# Patient Record
Sex: Female | Born: 1975 | Race: Black or African American | Hispanic: No | Marital: Married | Smoking: Never smoker
Health system: Southern US, Community
[De-identification: ages and names within clinical notes are randomized; demographics above are authoritative.]

## PROBLEM LIST (undated history)

## (undated) ENCOUNTER — Inpatient Hospital Stay: Payer: Self-pay

## (undated) DIAGNOSIS — Z789 Other specified health status: Secondary | ICD-10-CM

---

## 1997-11-26 ENCOUNTER — Other Ambulatory Visit: Admission: RE | Admit: 1997-11-26 | Discharge: 1997-11-26 | Payer: Self-pay | Admitting: Family Medicine

## 1999-05-29 ENCOUNTER — Other Ambulatory Visit: Admission: RE | Admit: 1999-05-29 | Discharge: 1999-05-29 | Payer: Self-pay | Admitting: Family Medicine

## 2000-08-26 ENCOUNTER — Other Ambulatory Visit: Admission: RE | Admit: 2000-08-26 | Discharge: 2000-08-26 | Payer: Self-pay | Admitting: Family Medicine

## 2002-08-17 ENCOUNTER — Other Ambulatory Visit: Admission: RE | Admit: 2002-08-17 | Discharge: 2002-08-17 | Payer: Self-pay | Admitting: Family Medicine

## 2010-01-23 ENCOUNTER — Encounter: Payer: Self-pay | Admitting: Maternal and Fetal Medicine

## 2010-06-12 ENCOUNTER — Inpatient Hospital Stay (HOSPITAL_COMMUNITY)
Admission: AD | Admit: 2010-06-12 | Discharge: 2010-06-17 | DRG: 886 | Disposition: A | Discharge status: Home / Self Care | Payer: BC Managed Care – PPO | Source: Ambulatory Visit | Attending: Obstetrics and Gynecology | Admitting: Obstetrics and Gynecology

## 2010-06-12 ENCOUNTER — Inpatient Hospital Stay (HOSPITAL_COMMUNITY): Payer: BC Managed Care – PPO

## 2010-06-12 DIAGNOSIS — O4410 Placenta previa with hemorrhage, unspecified trimester: Principal | ICD-10-CM | POA: Diagnosis present

## 2010-06-12 LAB — CBC
HCT: 34.8 % — ABNORMAL LOW (ref 36.0–46.0)
MCHC: 33 g/dL (ref 30.0–36.0)
MCV: 87.2 fL (ref 78.0–100.0)
RDW: 13 % (ref 11.5–15.5)
WBC: 5.8 10*3/uL (ref 4.0–10.5)

## 2010-06-13 DIAGNOSIS — O479 False labor, unspecified: Secondary | ICD-10-CM

## 2010-06-13 LAB — CBC
Hemoglobin: 11.9 g/dL — ABNORMAL LOW (ref 12.0–15.0)
MCH: 29 pg (ref 26.0–34.0)
MCHC: 33.2 g/dL (ref 30.0–36.0)

## 2010-06-13 LAB — STREP B DNA PROBE: Strep Group B Ag: NEGATIVE

## 2010-06-20 NOTE — Discharge Summary (Signed)
  Kim Wood, Kim Wood          ACCOUNT NO.:  [id number]  MEDICAL RECORD NO.:  [id number]           PATIENT TYPE:  I  LOCATION:  9159                          FACILITY:  [facility]  PHYSICIAN:  Scheryl Darter, MD       DATE OF BIRTH:  [birth year]  DATE OF ADMISSION:  06/12/2010 DATE OF DISCHARGE:  06/17/2010                              DISCHARGE SUMMARY   DIAGNOSES: 1. Intrauterine pregnancy at 35 weeks, 5 days gestation. 2. Low-lying placenta with third trimester bleed.  The patient is a 35 year old black female gravida 1, para 0, estimated date of confinement on July 17, 2010.  Prenatal care was at [facility] in [place].  She lives in [place].  She had been followed with complete placenta previa.  At about 4:00 a.m. on Jun 12, 2010, she woke with bright red vaginal bleeding.  EMS was called, and she was transported to [facility] because of her proximity.  She has had some contractions.  No rupture of membranes.  She noted fetal movement.  PAST MEDICAL HISTORY:  Unremarkable.  PAST SURGICAL HISTORY:  None.  SOCIAL HISTORY:  The patient is married, nonsmoker, and denies alcohol or drug use.  MEDICATION:  Only medication was prenatal vitamins.  No known drug allergies.  REVIEW OF SYSTEMS:  Positive as noted.  PHYSICAL EXAMINATION:  GENERAL:  The patient is in no acute distress. VITAL SIGNS:  Blood pressure was 139/83, pulse of 63, respirations 18, temperature 97.8. CHEST:  Clear. HEART:  Regular rate and rhythm. ABDOMEN:  Gravid consistent with dates and soft and nontender. PELVIC:  Blood in the vagina with clots.  Cervical os was closed and cervix was long.  There was no active bleeding at the time of examination.  She had contractions at every 10 minutes.  Fetal heart rate is in 150s with no decelerations.  The patient was admitted for observation.  She had good fetal monitoring.  Ultrasound showed posterior marginal placenta previa with placenta  measured normal cervical length.  There was no sign of abruption visualized.  There was normal amniotic fluid.  The patient had only some old blood with spotting after the initial episode.  She was having significant uterine activity.  No sign of labor.  On Jun 17, 2010, she had had no bleeding for greater than 48 hours.  She was discharged home.  She was instructed to have decreased activity and nothing per vagina.  She is to report any further episodes of bleeding.  She is to see her physicians at [facility] in 2 days.  She stated that she was going to relocate closer to [facility] as she previously planned to do.  She is to be out of work.  Her questions were answered.     Scheryl Darter, MD     JA/MEDQ  D:  06/17/2010  T:  06/17/2010  Job:  [id number]  Electronically Signed by Scheryl Darter MD on 06/20/2010 10:30:00 AM

## 2010-07-18 ENCOUNTER — Inpatient Hospital Stay: Payer: Self-pay | Admitting: Obstetrics & Gynecology

## 2012-04-04 ENCOUNTER — Inpatient Hospital Stay: Payer: Self-pay | Admitting: Obstetrics and Gynecology

## 2012-04-04 LAB — CBC WITH DIFFERENTIAL/PLATELET
Basophil %: 1.1 %
HCT: 38.1 % (ref 35.0–47.0)
HGB: 12.8 g/dL (ref 12.0–16.0)
MCV: 89 fL (ref 80–100)
Monocyte #: 0.3 x10 3/mm (ref 0.2–0.9)
Neutrophil #: 4 10*3/uL (ref 1.4–6.5)
Neutrophil %: 68.2 %
Platelet: 196 10*3/uL (ref 150–440)
WBC: 5.9 10*3/uL (ref 3.6–11.0)

## 2012-04-06 LAB — HEMATOCRIT: HCT: 33.6 % — ABNORMAL LOW (ref 35.0–47.0)

## 2014-04-25 ENCOUNTER — Ambulatory Visit
Admit: 2014-04-25 | Disposition: A | Discharge status: Home / Self Care | Payer: Self-pay | Attending: Advanced Practice Midwife | Admitting: Advanced Practice Midwife

## 2014-05-11 ENCOUNTER — Ambulatory Visit
Admit: 2014-05-11 | Disposition: A | Discharge status: Home / Self Care | Payer: Self-pay | Attending: Advanced Practice Midwife | Admitting: Advanced Practice Midwife

## 2014-06-01 NOTE — Op Note (Signed)
PATIENT NAME:  Kim Wood, Kim Wood MR#:  [id number] DATE OF BIRTH:  January 10, 1976  DATE OF PROCEDURE:  04/05/2012  PREOPERATIVE DIAGNOSES: Term intrauterine pregnancy, prior history of cesarean section, desire for repeat cesarean section, labor.   POSTOPERATIVE DIAGNOSES: Term intrauterine pregnancy, prior history of cesarean section, desire for repeat cesarean section, labor.   PROCEDURE PERFORMED: Low transverse cesarean section.   SURGEON: Kenton Kingfisher   ASSISTANT: Midwife Danise Mina.   ANESTHESIA: Spinal.   ESTIMATED BLOOD LOSS: 500 mL.   COMPLICATIONS: None.   FINDINGS: Normal tubes, ovaries, and uterus. Delivery of a viable female infant weighing  7 pounds 2 ounces with Apgar scores of 9 and 9 at one and five minutes, respectively.   TECHNIQUE: The patient was prepped and draped in the usual sterile fashion. After adequate anesthesia was obtained in the supine position on the operating room table Marcaine 0.5% was used to anesthetize the area of the prior scar, followed by a low transverse skin incision using a scalpel down to the level of the rectus fascia. The rectus fascia was dissected bilaterally using Mayo scissors, and then the rectus muscles were separated from the fascia and the midline. Peritoneum was penetrated. Bladder was dissected inferiorly and retracted away from the lower uterine segment. Scalpel was used to create a low transverse hysterotomy incision that was extended by blunt dissection, and then amniotomy reveals clear fluid. The infant's head was grasped and delivered without the need for a suction device, and the oropharynx was suctioned. No nuchal cord was encountered. Infant was then delivered completely, without complication.   Cord blood was obtained and the placenta was manually extracted. The uterus was externalized and cleansed of all membranes and debris using a moist sponge. The hysterotomy incision was closed with a running #1 Vicryl suture in a locking fashion,  with excellent hemostasis noted. The uterus was placed back in the intraabdominal cavity, and the paracolic gutters were irrigated with warm saline. Re-examination of the incision reveals excellent hemostasis, and Interceed was placed over the incision to minimize adhesion formation.   The peritoneum was closed with a 1-0 Vicryl suture. Trocars were placed through the abdomen to the subfascial space, through which Silver Soaker catheters were guided into place for local administration to the On-Q pain pump system.   The rectus fascia was closed with 0 Maxon suture. Subcutaneous tissues were irrigated and hemostasis was assured using electrocautery. Skin was closed with a 4-0 Vicryl suture in a subcuticular fashion, followed by placement of Dermabond for skin closure. The catheters of the On-Q pain pump system were stabilized in place with Steri-Strips and a Tegaderm bandage. The patient went to the recovery room in stable condition. The catheters were flushed with 5 mL each of bupivacaine.   All sponge, instrument, and needle counts were correct.     ____________________________ R. Barnett Applebaum, MD rph:dm D: 04/05/2012 01:06:00 ET T: 04/05/2012 09:41:19 ET JOB#: [id number]  cc: Glean Salen, MD, <Dictator> Gae Dry MD ELECTRONICALLY SIGNED 04/07/2012 9:00

## 2014-06-19 NOTE — H&P (Signed)
L&D Evaluation:  History:  HPI 39 year old G2 P1001 with EDC=04/15/2012 by a 8wk3d ultrasound presents at 64 3/7 weeks with c/o onset contractions at 1745 this evening. Hx of a prior C-section for LLP with vaginal bleeding at term in 2012. Scheduled for a repeat C-section 4 March. Denies VB or LOF, dysuria, N/V. Has had two loose stools today. Has been at work all day. Last ate at 6PM-soup. PNC at [facility] remarkable for early onset, two small uterine fibroids noted on early ultrasound, closely spaced pregnancies, AMA (declined prenatal genetic testing), and a normal anatomy scan.   Presents with contractions   Patient's Medical History Uterine fibroids   Patient's Surgical History Previous C-Section  Wisdom teeth extraction   Medications Pre Natal Vitamins   Allergies NKDA   Social History none   Family History Non-Contributory   ROS:  ROS All systems were reviewed.  HEENT, CNS, GI, GU, Respiratory, CV, Renal and Musculoskeletal systems were found to be normal.   Exam:  Vital Signs stable   Urine Protein not completed   General holding breath thru contractions   Mental Status clear   Chest clear   Heart normal sinus rhythm, no murmur/gallop/rubs   Abdomen gravid, tender with contractions   Estimated Fetal Weight Average for gestational age   Fetal Position cephalic   Edema no edema   Reflexes 1+   Pelvic no external lesions, 1.5/75%/-1 to 0/posterior   Mebranes Intact   FHT normal rate with no decels, CAT 1-baseline 120s with many accels, mod variability   Fetal Heart Rate 125   Ucx q6-7   Skin dry   Impression:  Impression IUP at 38 3/7 weeks with con tractions. R/O labor   Plan:  Plan EFM/NST, monitor contractions and for cervical change, IV fluids. NPO. If continues to contract/change cx, will consult  Dr Kenton Kingfisher for  repeat C-section.   Electronic Signatures: Karene Fry (CNM)  (Signed 24-Feb-14 20:50)  Authored: L&D Evaluation   Last  Updated: 24-Feb-14 20:50 by Karene Fry (CNM)

## 2016-01-09 ENCOUNTER — Ambulatory Visit
Admission: RE | Admit: 2016-01-09 | Discharge: 2016-01-09 | Disposition: A | Discharge status: Home / Self Care | Payer: BC Managed Care – PPO | Source: Ambulatory Visit | Attending: Obstetrics and Gynecology | Admitting: Obstetrics and Gynecology

## 2016-01-09 VITALS — BP 126/72 | HR 90 | Temp 98.1°F | Resp 18 | Ht 68.4 in | Wt 186.2 lb

## 2016-01-09 DIAGNOSIS — Z3169 Encounter for other general counseling and advice on procreation: Secondary | ICD-10-CM | POA: Diagnosis not present

## 2016-01-09 NOTE — Progress Notes (Addendum)
Referring Provider:   Mosetta Pigeon Length of Consultation: 45 minutes  Ms. Kim Wood was referred to [facility] for preconception genetic counseling due to advanced maternal age.  The patientis currently 40 years old and considering pregnancy in the near future.  This note summarizes the information we discussed.    We explained that the chance of a chromosome abnormality increases with maternal age.  Chromosomes and examples of chromosome problems were reviewed.  Humans typically have 46 chromosomes in each cell, with half passed through each sperm and egg.  Any change in the number or structure of chromosomes can increase the risk of problems in the physical and mental development of a pregnancy.   Based upon the likely age of the patient, 23,  the term chance of any chromosome abnormality would be 1 in 27. The term chance of Down syndrome, the most common chromosome problem associated with maternal age, would be 1 in 79.  The risk of chromosome problems is in addition to the 3% general population risk for birth defects and mental retardation.  The greatest chance, of course, is that the baby would be born in good health.  We discussed the following prenatal screening and testing options for any future pregnancy:  First trimester screening, which includes nuchal translucency ultrasound screen and first trimester maternal serum marker screening.  The nuchal translucency has approximately an 80% detection rate for Down syndrome and can be positive for other chromosome abnormalities as well as heart defects.  When combined with a maternal serum marker screening, the detection rate is up to 90% for Down syndrome and up to 97% for trisomy 18.     The chorionic villus sampling procedure is available for first trimester chromosome analysis.  This involves the withdrawal of a small amount of chorionic villi (tissue from the developing placenta).  Risk of pregnancy loss is estimated to  be approximately 1 in 200 to 1 in 100 (0.5 to 1%).  There is approximately a 1% (1 in 100) chance that the CVS chromosome results will be unclear.  Chorionic villi cannot be tested for neural tube defects.     Maternal serum marker screening, a blood test that measures pregnancy proteins, can provide risk assessments for Down syndrome, trisomy 18, and open neural tube defects (spina bifida, anencephaly). Because it does not directly examine the fetus, it cannot positively diagnose or rule out these problems.  Targeted ultrasound uses high frequency sound waves to create an image of the developing fetus.  An ultrasound is often recommended as a routine means of evaluating the pregnancy.  It is also used to screen for fetal anatomy problems (for example, a heart defect) that might be suggestive of a chromosomal or other abnormality.   Amniocentesis involves the removal of a small amount of amniotic fluid from the sac surrounding the fetus with the use of a thin needle inserted through the maternal abdomen and uterus.  Ultrasound guidance is used throughout the procedure.  Fetal cells from amniotic fluid are directly evaluated and > 99.5% of chromosome problems and > 98% of open neural tube defects can be detected. This procedure is generally performed after the 15th week of pregnancy.  The main risks to this procedure include complications leading to miscarriage in less than 1 in 200 cases (0.5%).  We also reviewed the availability of cell free fetal DNA testing from maternal blood to determine whether or not the baby may have either Down syndrome, trisomy 51, or trisomy  18.  This test utilizes a maternal blood sample and DNA sequencing technology to isolate circulating cell free fetal DNA from maternal plasma.  The fetal DNA can then be analyzed for DNA sequences that are derived from the three most common chromosomes involved in aneuploidy, chromosomes 13, 18, and 21.  If the overall amount of DNA is greater  than the expected level for any of these chromosomes, aneuploidy is suspected.  While we do not consider it a replacement for invasive testing and karyotype analysis, a negative result from this testing would be reassuring, though not a guarantee of a normal chromosome complement for the baby.  An abnormal result is certainly suggestive of an abnormal chromosome complement, though we would still recommend CVS or amniocentesis to confirm any findings from this testing.  Cystic Fibrosis and Spinal Muscular Atrophy (SMA) screening were also discussed with the patient. Both conditions are recessive, which means that both parents must be carriers in order to have a child with the disease.  Cystic fibrosis (CF) is one of the most common genetic conditions in persons of Caucasian ancestry.  This condition occurs in approximately 1 in 2,500 Caucasian persons and results in thickened secretions in the lungs, digestive, and reproductive systems.  For a baby to be at risk for having CF, both of the parents must be carriers for this condition.  Approximately 1 in 12 Caucasian persons is a carrier for CF.  Current carrier testing looks for the most common mutations in the gene for CF and can detect approximately 90% of carriers in the Caucasian population.  This means that the carrier screening can greatly reduce, but cannot eliminate, the chance for an individual to have a child with CF.  If an individual is found to be a carrier for CF, then carrier testing would be available for the partner. As part of [place] newborn screening profile, all babies born in the state of [place] will have a two-tier screening process.  Specimens are first tested to determine the concentration of immunoreactive trypsinogen (IRT).  The top 5% of specimens with the highest IRT values then undergo DNA testing using a panel of over 40 common CF mutations. SMA is a neurodegenerative disorder that leads to atrophy of skeletal muscle  and overall weakness.  This condition is also more prevalent in the Caucasian population, with 1 in 40-1 in 60 persons being a carrier and 1 in 6,000-1 in 10,000 children being affected.  There are multiple forms of the disease, with some causing death in infancy to other forms with survival into adulthood.  The genetics of SMA is complex, but carrier screening can detect up to 95% of carriers in the Caucasian population.  Similar to CF, a negative result can greatly reduce, but cannot eliminate, the chance to have a child with SMA.  We obtained a detailed family history and pregnancy history.  Ms. Lauran Sundet stated that her paternal aunt has a child with sickle cell disease.  We talked about the inheritance of sickle cell and that if her aunt is a carrier, then her father has a 1 in 2 chance for being a carrier and therefore her risk is 1 in 70.  If testing was done in her prior pregnancies, and she was negative, no additional testing would be needed.  If not, we would recommend hemoglobinopathy testing for her prior to or early in any pregnancy. The remainder of the family history is unremarkable for birth defects, mental retardation, recurrent pregnancy  loss or known chromosome abnormalities.  Ms. Laasya Almazan has had two pregnancies and has two healthy sons, ages 29 and 35.  .  She reported no ongoing health conditions or medications that would increase the risk for birth defects in this pregnancy.  We would also recommend a fetal echocardiogram after [redacted] weeks gestation in any future pregnancy because of maternal age greater than 68 years.  We encouraged the patient to contact us when she becomes pregnancy and we will arrange an appointment at approximately [redacted] weeks gestation for ultrasound and a review of prenatal testing and screening options.  We can be contacted at [phone].   Wilburt Finlay, MS, CGC  I reviewed her care and agree with with plan   Gatha Mayer, MD

## 2016-03-10 ENCOUNTER — Encounter: Payer: Self-pay | Admitting: Emergency Medicine

## 2016-03-10 ENCOUNTER — Emergency Department
Admission: EM | Admit: 2016-03-10 | Discharge: 2016-03-10 | Disposition: A | Discharge status: Home / Self Care | Payer: BC Managed Care – PPO | Attending: Emergency Medicine | Admitting: Emergency Medicine

## 2016-03-10 ENCOUNTER — Emergency Department: Payer: BC Managed Care – PPO

## 2016-03-10 DIAGNOSIS — R102 Pelvic and perineal pain unspecified side: Secondary | ICD-10-CM

## 2016-03-10 DIAGNOSIS — O200 Threatened abortion: Secondary | ICD-10-CM | POA: Diagnosis not present

## 2016-03-10 DIAGNOSIS — O209 Hemorrhage in early pregnancy, unspecified: Secondary | ICD-10-CM | POA: Diagnosis present

## 2016-03-10 DIAGNOSIS — Z3A08 8 weeks gestation of pregnancy: Secondary | ICD-10-CM | POA: Insufficient documentation

## 2016-03-10 LAB — ABO/RH: ABO/RH(D): AB POS

## 2016-03-10 LAB — WET PREP, GENITAL
Clue Cells Wet Prep HPF POC: NONE SEEN
SPERM: NONE SEEN
Trich, Wet Prep: NONE SEEN
Yeast Wet Prep HPF POC: NONE SEEN

## 2016-03-10 LAB — URINALYSIS, COMPLETE (UACMP) WITH MICROSCOPIC
BILIRUBIN URINE: NEGATIVE
Glucose, UA: NEGATIVE mg/dL
KETONES UR: NEGATIVE mg/dL
LEUKOCYTES UA: NEGATIVE
Nitrite: NEGATIVE
Protein, ur: NEGATIVE mg/dL
Specific Gravity, Urine: 1.008 (ref 1.005–1.030)
pH: 6 (ref 5.0–8.0)

## 2016-03-10 LAB — BASIC METABOLIC PANEL
ANION GAP: 10 (ref 5–15)
BUN: 10 mg/dL (ref 6–20)
CO2: 19 mmol/L — ABNORMAL LOW (ref 22–32)
Calcium: 8.7 mg/dL — ABNORMAL LOW (ref 8.9–10.3)
Chloride: 108 mmol/L (ref 101–111)
Creatinine, Ser: 0.73 mg/dL (ref 0.44–1.00)
GFR calc Af Amer: >60 mL/min (ref >60)
Glucose, Bld: 91 mg/dL (ref 65–99)
POTASSIUM: 4 mmol/L (ref 3.5–5.1)
SODIUM: 137 mmol/L (ref 135–145)

## 2016-03-10 LAB — HCG, QUANTITATIVE, PREGNANCY: hCG, Beta Chain, Quant, S: 3681 m[IU]/mL — ABNORMAL HIGH (ref <5)

## 2016-03-10 LAB — CBC
HEMATOCRIT: 38.1 % (ref 35.0–47.0)
Hemoglobin: 12.8 g/dL (ref 12.0–16.0)
MCH: 28.8 pg (ref 26.0–34.0)
MCHC: 33.6 g/dL (ref 32.0–36.0)
MCV: 85.9 fL (ref 80.0–100.0)
Platelets: 269 10*3/uL (ref 150–440)
RBC: 4.43 MIL/uL (ref 3.80–5.20)
RDW: 13.6 % (ref 11.5–14.5)
WBC: 4.6 10*3/uL (ref 3.6–11.0)

## 2016-03-10 LAB — CHLAMYDIA/NGC RT PCR (ARMC ONLY)
Chlamydia Tr: NOT DETECTED
N gonorrhoeae: NOT DETECTED

## 2016-03-10 LAB — POCT PREGNANCY, URINE: PREG TEST UR: POSITIVE — AB

## 2016-03-10 MED ORDER — ACETAMINOPHEN 500 MG PO TABS
1000.0000 mg | ORAL_TABLET | Freq: Once | ORAL | Status: AC
  Administered 2016-03-10: 1000 mg via ORAL
  Filled 2016-03-10: qty 2

## 2016-03-10 NOTE — Discharge Instructions (Signed)
Please drink plenty of fluid and get plenty of rest. Make an appointment for follow-up on Thursday.  Return to the emergency department if you develop severe pain, lightheadedness or fainting, fever, increased bleeding or bleeding through more than one pad every 2 hours, or any other symptoms concerning to you.

## 2016-03-10 NOTE — ED Provider Notes (Signed)
[facility] Emergency Department Provider Note  ____________________________________________  Time seen: Approximately 3:13 PM  I have reviewed the triage vital signs and the nursing notes.   HISTORY  Chief Complaint Vaginal Bleeding    HPI Zona C Foust Layne Benton is a 41 y.o. female G3P2 approximately [redacted] weeks pregnant by LMP presenting with vaginal bleeding and pelvic cramping. The patient reports that for the past 4 days, she has had progressively increasing vaginal bleeding, with clots and possible passage of tissue. She has also had severe lower abdominal cramping. No fevers, chills, nausea or vomiting, lightheadedness or syncope. No shortness of breath. Has not seen OB, but did have a positive pregnancy test last week.   History reviewed. No pertinent past medical history.  Patient Active Problem List   Diagnosis Date Noted  . Encounter for preconception consultation     Past Surgical History:  Procedure Laterality Date  . CESAREAN SECTION     x2    Current Outpatient Rx  . Order #: [id number] Class: Historical Med    Allergies Patient has no known allergies.  No family history on file.  Social History Social History  Substance Use Topics  . Smoking status: Never Smoker  . Smokeless tobacco: Never Used  . Alcohol use Yes     Comment: 1 drink/month    Review of Systems Constitutional: No fever/chills.No lightheadedness or syncope. Eyes: No visual changes. ENT: No sore throat. No congestion or rhinorrhea. Cardiovascular: Denies chest pain. Denies palpitations. Respiratory: Denies shortness of breath.  No cough. Gastrointestinal: Positive lower abdominal pain.  No nausea, no vomiting.  No diarrhea.  No constipation. Genitourinary: Negative for dysuria. Positive vaginal bleeding. Positive pelvic pain. Musculoskeletal: Negative for back pain. Skin: Negative for rash. Neurological: Negative for headaches. No focal numbness, tingling or  weakness.   10-point ROS otherwise negative.  ____________________________________________   PHYSICAL EXAM:  VITAL SIGNS: ED Triage Vitals [03/10/16 1139]  Enc Vitals Group     BP (!) 163/104     Pulse Rate 66     Resp 18     Temp 98.7 F (37.1 C)     Temp Source Oral     SpO2 99 %     Weight 182 lb (82.6 kg)     Height 5\' 6"  (1.676 m)     Head Circumference      Peak Flow      Pain Score 5     Pain Loc      Pain Edu?      Excl. in [place]?     Constitutional: Alert and oriented. Well appearing and in no acute distress. Answers questions appropriately. Eyes: Conjunctivae are normal.  EOMI. No scleral icterus. Head: Atraumatic. Nose: No congestion/rhinnorhea. Mouth/Throat: Mucous membranes are moist.  Neck: No stridor.  Supple.   Cardiovascular: Normal rate, regular rhythm. No murmurs, rubs or gallops.  Respiratory: Normal respiratory effort.  No accessory muscle use or retractions. Lungs CTAB.  No wheezes, rales or ronchi. Gastrointestinal: Soft, nontender and nondistended.  No guarding or rebound.  No peritoneal signs. Genitourinary: Normal-appearing external genitalia without lesions. Normal vaginal exam with physiologic discharge, normal-appearing cervix, normal vaginal wall tissue. Bimanual exam is negative for CMT, adnexal tenderness to palpation, no palpable masses. Cervix is closed.  Musculoskeletal: No LE edema.  Neurologic:  A&Ox3.  Speech is clear.  Face and smile are symmetric.  EOMI.  Moves all extremities well. Skin:  Skin is warm, dry and intact. No rash noted. Psychiatric: Intermittently tearful  during the examination Speech and behavior are normal.  Normal judgement.  ____________________________________________   LABS (all labs ordered are listed, but only abnormal results are displayed)  Labs Reviewed  WET PREP, GENITAL - Abnormal; Notable for the following:       Result Value   WBC, Wet Prep HPF POC FEW (*)    All other components within normal  limits  HCG, QUANTITATIVE, PREGNANCY - Abnormal; Notable for the following:    hCG, Beta Chain, Quant, S 3,681 (*)    All other components within normal limits  URINALYSIS, COMPLETE (UACMP) WITH MICROSCOPIC - Abnormal; Notable for the following:    Color, Urine YELLOW (*)    APPearance CLEAR (*)    Hgb urine dipstick MODERATE (*)    Bacteria, UA RARE (*)    Squamous Epithelial / LPF 0-5 (*)    All other components within normal limits  BASIC METABOLIC PANEL - Abnormal; Notable for the following:    CO2 19 (*)    Calcium 8.7 (*)    All other components within normal limits  POCT PREGNANCY, URINE - Abnormal; Notable for the following:    Preg Test, Ur POSITIVE (*)    All other components within normal limits  CHLAMYDIA/NGC RT PCR ([facility] ONLY)  CBC  POC URINE PREG, ED  ABO/RH   ____________________________________________  EKG  Not indicated ____________________________________________  RADIOLOGY  US Ob Comp Less 14 Wks  Result Date: 03/10/2016 CLINICAL DATA:  41 y/o F; 3 days of lower abdominal pain and vaginal bleeding. Quantitative beta HCG 3,681. EXAM: OBSTETRIC <14 WK [place] AND TRANSVAGINAL OB US TECHNIQUE: Both transabdominal and transvaginal ultrasound examinations were performed for complete evaluation of the gestation as well as the maternal uterus, adnexal regions, and pelvic cul-de-sac. Transvaginal technique was performed to assess early pregnancy. COMPARISON:  None. FINDINGS: Intrauterine gestational sac: None Yolk sac:  Not Visualized. Embryo:  Not Visualized. Cardiac Activity: Not Visualized. Subchorionic hemorrhage:  None visualized. Maternal uterus/adnexae: Partially obscured adnexa due to overlying bowel gas. Bilateral ovaries appear normal with blood flow present on color and spectral Doppler. IMPRESSION: Pregnancy of unknown location. If Beta hCG measurement is >3000, a viable intrauterine pregnancy is possible, but unlikely. Follow-up beta HCG and pelvic  ultrasound as indicated is recommended. This recommendation follows SRU consensus guidelines: Diagnostic Criteria for Nonviable Pregnancy Early in the First Trimester. [place] Med [id number]. Electronically Signed   By: Kristine Garbe M.D.   On: 03/10/2016 18:51   US Ob Transvaginal  Result Date: 03/10/2016 CLINICAL DATA:  41 y/o F; 3 days of lower abdominal pain and vaginal bleeding. Quantitative beta HCG 3,681. EXAM: OBSTETRIC <14 WK [place] AND TRANSVAGINAL OB US TECHNIQUE: Both transabdominal and transvaginal ultrasound examinations were performed for complete evaluation of the gestation as well as the maternal uterus, adnexal regions, and pelvic cul-de-sac. Transvaginal technique was performed to assess early pregnancy. COMPARISON:  None. FINDINGS: Intrauterine gestational sac: None Yolk sac:  Not Visualized. Embryo:  Not Visualized. Cardiac Activity: Not Visualized. Subchorionic hemorrhage:  None visualized. Maternal uterus/adnexae: Partially obscured adnexa due to overlying bowel gas. Bilateral ovaries appear normal with blood flow present on color and spectral Doppler. IMPRESSION: Pregnancy of unknown location. If Beta hCG measurement is >3000, a viable intrauterine pregnancy is possible, but unlikely. Follow-up beta HCG and pelvic ultrasound as indicated is recommended. This recommendation follows SRU consensus guidelines: Diagnostic Criteria for Nonviable Pregnancy Early in the First Trimester. [place] Med [id number]. Electronically Signed  By: Kristine Garbe M.D.   On: 03/10/2016 18:51    ____________________________________________   PROCEDURES  Procedure(s) performed: None  Procedures  Critical Care performed: No ____________________________________________   INITIAL IMPRESSION / ASSESSMENT AND PLAN / ED COURSE  Pertinent labs & imaging results that were available during my care of the patient were reviewed by me and considered in my medical decision  making (see chart for details).  41 y.o. G3 P0 approximately 8 weeks by LMP presenting with vaginal bleeding, passage of clots and tissue, and pelvic cramping. Plan to evaluate for ectopic pregnancy, threatened AB, subchorionic hemorrhage, and vaginal infection. The patient does have stable vital signs and stable blood counts. I will also need an Rh factor.  ----------------------------------------- 7:18 PM on 03/10/2016 -----------------------------------------  The patient is Rh+. Her ultrasound does not show an intrauterine pregnancy although no major abnormalities are seen in the adnexa either. I've spoken with Dr. Star Age, the OB/GYN on call for [facility] which is the patient's clinic, and he ensure that the patient is able to be seen in 2 days. I have given her follow-up instructions as well as clear ectopic precautions. ____________________________________________  FINAL CLINICAL IMPRESSION(S) / ED DIAGNOSES  Final diagnoses:  Threatened miscarriage  Pelvic pain         NEW MEDICATIONS STARTED DURING THIS VISIT:  New Prescriptions   No medications on file      Eula Listen, MD 03/10/16 1918

## 2016-03-10 NOTE — ED Triage Notes (Signed)
Patient presents to the ED with vaginal bleeding that began on Saturday and lower abdominal pain.  Patient reports heavy bleeding with lots of large clots.  Patient reports wearing large sized pads that she is changing approximately 3 times a day.  Patient had a positive pregnancy test on Thursday.  Patient states her last period was December 4th.  Patient reports history of placenta previa.

## 2016-03-19 ENCOUNTER — Encounter: Payer: Self-pay | Admitting: Certified Nurse Midwife

## 2016-06-05 ENCOUNTER — Other Ambulatory Visit: Payer: Self-pay | Admitting: Certified Nurse Midwife

## 2016-06-05 ENCOUNTER — Ambulatory Visit (INDEPENDENT_AMBULATORY_CARE_PROVIDER_SITE_OTHER): Payer: BC Managed Care – PPO | Admitting: Certified Nurse Midwife

## 2016-06-05 ENCOUNTER — Encounter: Payer: Self-pay | Admitting: Certified Nurse Midwife

## 2016-06-05 ENCOUNTER — Ambulatory Visit (INDEPENDENT_AMBULATORY_CARE_PROVIDER_SITE_OTHER): Payer: BC Managed Care – PPO

## 2016-06-05 VITALS — BP 129/81 | HR 74 | Ht 66.5 in | Wt 184.0 lb

## 2016-06-05 DIAGNOSIS — Z3490 Encounter for supervision of normal pregnancy, unspecified, unspecified trimester: Secondary | ICD-10-CM

## 2016-06-05 DIAGNOSIS — Z369 Encounter for antenatal screening, unspecified: Secondary | ICD-10-CM | POA: Diagnosis not present

## 2016-06-05 DIAGNOSIS — N912 Amenorrhea, unspecified: Secondary | ICD-10-CM | POA: Diagnosis not present

## 2016-06-05 DIAGNOSIS — O09529 Supervision of elderly multigravida, unspecified trimester: Secondary | ICD-10-CM | POA: Diagnosis not present

## 2016-06-05 LAB — POCT URINE PREGNANCY: Preg Test, Ur: POSITIVE — AB

## 2016-06-05 NOTE — Patient Instructions (Signed)

## 2016-06-05 NOTE — Progress Notes (Signed)
Pt is here for a confirmation of pregnancy. One positive home pregnancy test. States she had an ectopic in Jan.

## 2016-06-05 NOTE — Progress Notes (Signed)
Subjective:    Kim Wood is a 40 y.o. female who presents for evaluation of amenorrhea. She believes she could be pregnant. Pregnancy is desired. Sexual Activity: single partner, contraception: none. Current symptoms also include: breast tenderness. Last period was abnormal.   Patient's last menstrual period was 03/21/2016 (approximate). The following portions of the patient's history were reviewed and updated as appropriate: allergies, current medications, past family history, past medical history, past social history, past surgical history and problem list.  Review of Systems Pertinent items are noted in HPI.     Objective:    BP 129/81   Pulse 74   Ht 5' 6.5" (1.689 m)   Wt 184 lb (83.5 kg)   LMP 03/21/2016 (Approximate)   BMI 29.25 kg/m  General: alert, cooperative, appears stated age and no acute distress    Lab Review Urine HCG: positive    Assessment:    Absence of menstruation.     Plan:   Briefly discussed pre-natal care options. Encouraged well-balanced diet, plenty of rest when needed, pre-natal vitamins daily and walking for exercise. Discussed self-help for nausea, avoiding OTC medications until consulting provider or pharmacist, other than Tylenol as needed, minimal caffeine (1-2 cups daily) and avoiding alcohol. She will schedule her initial nurse OB visit in 2 wks. A dating ultrasound ASAP and a NOB physical exam @ [redacted] wks gestation.   Philip Aspen, CNM

## 2016-06-11 ENCOUNTER — Ambulatory Visit: Payer: BC Managed Care – PPO | Admitting: Certified Nurse Midwife

## 2016-06-11 VITALS — BP 140/85 | HR 75 | Wt 184.2 lb

## 2016-06-11 DIAGNOSIS — Z3481 Encounter for supervision of other normal pregnancy, first trimester: Secondary | ICD-10-CM

## 2016-06-11 DIAGNOSIS — Z202 Contact with and (suspected) exposure to infections with a predominantly sexual mode of transmission: Secondary | ICD-10-CM

## 2016-06-11 DIAGNOSIS — O09521 Supervision of elderly multigravida, first trimester: Secondary | ICD-10-CM

## 2016-06-11 NOTE — Progress Notes (Signed)
Kim Wood presents for NOB nurse interview visit. Pregnancy confirmation done _4/27/18__with AT___.  Gae Bon .  P- .2 U/S completed 06/05/16. EDD confirmed 01/06/17.  Pregnancy education material explained and given. _0__ cats in the home. NOB labs ordered. Labs pending. , (sickle cell). HIV labs and Drug screen were explained optional and she did not decline. Drug screen ordered. PNV encouraged. Genetic screening options discussed. Genetic testing:Unsure. NOB labs ordered today.Pt to call back if genetic testing is desired. NOB labs and genetic test to be drawn 1 week prior to new OB PE with AT.Pap per pt 02/2016 WNL.  Pt may discuss with provider. Pt. To follow up with provider in _3_ weeks for NOB physical.  All questions answered.

## 2016-06-16 ENCOUNTER — Other Ambulatory Visit: Payer: BC Managed Care – PPO

## 2016-06-16 DIAGNOSIS — Z202 Contact with and (suspected) exposure to infections with a predominantly sexual mode of transmission: Secondary | ICD-10-CM

## 2016-06-16 DIAGNOSIS — Z3481 Encounter for supervision of other normal pregnancy, first trimester: Secondary | ICD-10-CM

## 2016-06-17 LAB — CBC WITH DIFFERENTIAL
BASOS: 0 %
Basophils Absolute: 0 10*3/uL (ref 0.0–0.2)
EOS (ABSOLUTE): 0.1 10*3/uL (ref 0.0–0.4)
EOS: 1 %
HEMATOCRIT: 39.2 % (ref 34.0–46.6)
HEMOGLOBIN: 12.7 g/dL (ref 11.1–15.9)
IMMATURE GRANULOCYTES: 0 %
Immature Grans (Abs): 0 10*3/uL (ref 0.0–0.1)
LYMPHS: 19 %
Lymphocytes Absolute: 0.9 10*3/uL (ref 0.7–3.1)
MCH: 26.7 pg (ref 26.6–33.0)
MCHC: 32.4 g/dL (ref 31.5–35.7)
MCV: 82 fL (ref 79–97)
MONOCYTES: 5 %
MONOS ABS: 0.3 10*3/uL (ref 0.1–0.9)
Neutrophils Absolute: 3.7 10*3/uL (ref 1.4–7.0)
Neutrophils: 75 %
RBC: 4.76 x10E6/uL (ref 3.77–5.28)
RDW: 14 % (ref 12.3–15.4)
WBC: 5 10*3/uL (ref 3.4–10.8)

## 2016-06-17 LAB — SICKLE CELL SCREEN: Sickle Cell Screen: NEGATIVE

## 2016-06-17 LAB — VARICELLA ZOSTER ANTIBODY, IGG: Varicella zoster IgG: 1002 index (ref >165)

## 2016-06-17 LAB — HIV ANTIBODY (ROUTINE TESTING W REFLEX): HIV SCREEN 4TH GENERATION: NONREACTIVE

## 2016-06-17 LAB — RPR: RPR: NONREACTIVE

## 2016-06-17 LAB — ABO AND RH: RH TYPE: POSITIVE

## 2016-06-17 LAB — ANTIBODY SCREEN: Antibody Screen: NEGATIVE

## 2016-06-17 LAB — HEPATITIS B SURFACE ANTIGEN: HEP B S AG: NEGATIVE

## 2016-06-17 LAB — RUBELLA SCREEN: Rubella Antibodies, IGG: 1.39 index (ref >0.99)

## 2016-06-18 LAB — MONITOR DRUG PROFILE 14(MW)
Amphetamine Scrn, Ur: NEGATIVE ng/mL
BARBITURATE SCREEN URINE: NEGATIVE ng/mL
BENZODIAZEPINE SCREEN, URINE: NEGATIVE ng/mL
Buprenorphine, Urine: NEGATIVE ng/mL
CANNABINOIDS UR QL SCN: NEGATIVE ng/mL
CREATININE(CRT), U: 115 mg/dL (ref 20.0–300.0)
Cocaine (Metab) Scrn, Ur: NEGATIVE ng/mL
FENTANYL, URINE: NEGATIVE pg/mL
METHADONE SCREEN, URINE: NEGATIVE ng/mL
Meperidine Screen, Urine: NEGATIVE ng/mL
OPIATE SCREEN URINE: NEGATIVE ng/mL
OXYCODONE+OXYMORPHONE UR QL SCN: NEGATIVE ng/mL
PHENCYCLIDINE QUANTITATIVE URINE: NEGATIVE ng/mL
Ph of Urine: 6.3 (ref 4.5–8.9)
Propoxyphene Scrn, Ur: NEGATIVE ng/mL
SPECIFIC GRAVITY: 1.021
TRAMADOL SCREEN, URINE: NEGATIVE ng/mL

## 2016-06-18 LAB — NICOTINE SCREEN, URINE: COTININE UR QL SCN: NEGATIVE ng/mL

## 2016-06-18 LAB — URINE CULTURE, OB REFLEX

## 2016-06-18 LAB — URINALYSIS, ROUTINE W REFLEX MICROSCOPIC
Bilirubin, UA: NEGATIVE
Glucose, UA: NEGATIVE
KETONES UA: NEGATIVE
Leukocytes, UA: NEGATIVE
NITRITE UA: NEGATIVE
Protein, UA: NEGATIVE
RBC UA: NEGATIVE
SPEC GRAV UA: 1.017 (ref 1.005–1.030)
Urobilinogen, Ur: 0.2 mg/dL (ref 0.2–1.0)
pH, UA: 6.5 (ref 5.0–7.5)

## 2016-06-18 LAB — CULTURE, OB URINE

## 2016-06-23 ENCOUNTER — Telehealth: Payer: Self-pay | Admitting: Certified Nurse Midwife

## 2016-06-23 NOTE — Telephone Encounter (Signed)
Patient lvm to reschedule appointment on 06/26/2016 10am to a later time if available. I lvm for patient to call back to reschedule appointment. Thank you.

## 2016-06-26 ENCOUNTER — Ambulatory Visit (INDEPENDENT_AMBULATORY_CARE_PROVIDER_SITE_OTHER): Payer: BC Managed Care – PPO | Admitting: Certified Nurse Midwife

## 2016-06-26 ENCOUNTER — Encounter: Payer: BC Managed Care – PPO | Admitting: Certified Nurse Midwife

## 2016-06-26 VITALS — BP 132/81 | HR 91 | Wt 181.0 lb

## 2016-06-26 DIAGNOSIS — O09522 Supervision of elderly multigravida, second trimester: Secondary | ICD-10-CM

## 2016-06-26 DIAGNOSIS — Z98891 History of uterine scar from previous surgery: Secondary | ICD-10-CM

## 2016-06-26 LAB — POCT URINALYSIS DIPSTICK
BILIRUBIN UA: NEGATIVE
GLUCOSE UA: NEGATIVE
Ketones, UA: NEGATIVE
NITRITE UA: NEGATIVE
Spec Grav, UA: 1.02 (ref 1.010–1.025)
Urobilinogen, UA: 0.2 E.U./dL
pH, UA: 6.5 (ref 5.0–8.0)

## 2016-06-26 NOTE — Patient Instructions (Addendum)
How a Baby Grows During Pregnancy Pregnancy begins when a female's sperm enters a female's egg (fertilization). This happens in one of the tubes (fallopian tubes) that connect the ovaries to the womb (uterus). The fertilized egg is called an embryo until it reaches 10 weeks. From 10 weeks until birth, it is called a fetus. The fertilized egg moves down the fallopian tube to the uterus. Then it implants into the lining of the uterus and begins to grow. The developing fetus receives oxygen and nutrients through the pregnant woman's bloodstream and the tissues that grow (placenta) to support the fetus. The placenta is the life support system for the fetus. It provides nutrition and removes waste. Learning as much as you can about your pregnancy and how your baby is developing can help you enjoy the experience. It can also make you aware of when there might be a problem and when to ask questions. How long does a typical pregnancy last? A pregnancy usually lasts 280 days, or about 40 weeks. Pregnancy is divided into three trimesters:  First trimester: 0-13 weeks.  Second trimester: 14-27 weeks.  Third trimester: 28-40 weeks. The day when your baby is considered ready to be born (full term) is your estimated date of delivery. How does my baby develop month by month? First month  The fertilized egg attaches to the inside of the uterus.  Some cells will form the placenta. Others will form the fetus.  The arms, legs, brain, spinal cord, lungs, and heart begin to develop.  At the end of the first month, the heart begins to beat. Second month  The bones, inner ear, eyelids, hands, and feet form.  The genitals develop.  By the end of 8 weeks, all major organs are developing. Third month  All of the internal organs are forming.  Teeth develop below the gums.  Bones and muscles begin to grow. The spine can flex.  The skin is transparent.  Fingernails and toenails begin to form.  Arms and  legs continue to grow longer, and hands and feet develop.  The fetus is about 3 in (7.6 cm) long. Fourth month  The placenta is completely formed.  The external sex organs, neck, outer ear, eyebrows, eyelids, and fingernails are formed.  The fetus can hear, swallow, and move its arms and legs.  The kidneys begin to produce urine.  The skin is covered with a white waxy coating (vernix) and very fine hair (lanugo). Fifth month  The fetus moves around more and can be felt for the first time (quickening).  The fetus starts to sleep and wake up and may begin to suck its finger.  The nails grow to the end of the fingers.  The organ in the digestive system that makes bile (gallbladder) functions and helps to digest the nutrients.  If your baby is a girl, eggs are present in her ovaries. If your baby is a boy, testicles start to move down into his scrotum. Sixth month  The lungs are formed, but the fetus is not yet able to breathe.  The eyes open. The brain continues to develop.  Your baby has fingerprints and toe prints. Your baby's hair grows thicker.  At the end of the second trimester, the fetus is about 9 in (22.9 cm) long. Seventh month  The fetus kicks and stretches.  The eyes are developed enough to sense changes in light.  The hands can make a grasping motion.  The fetus responds to sound. Eighth month    All organs and body systems are fully developed and functioning.  Bones harden and taste buds develop. The fetus may hiccup.  Certain areas of the brain are still developing. The skull remains soft. Ninth month  The fetus gains about  lb (0.23 kg) each week.  The lungs are fully developed.  Patterns of sleep develop.  The fetus's head typically moves into a head-down position (vertex) in the uterus to prepare for birth. If the buttocks move into a vertex position instead, the baby is breech.  The fetus weighs 6-9 lbs (2.72-4.08 kg) and is 19-20 in  (48.26-50.8 cm) long. What can I do to have a healthy pregnancy and help my baby develop?  Eating and Drinking  Eat a healthy diet.  Talk with your health care provider to make sure that you are getting the nutrients that you and your baby need.  Visit www.choosemyplate.gov to learn about creating a healthy diet.  Gain a healthy amount of weight during pregnancy as advised by your health care provider. This is usually 25-35 pounds. You may need to:  Gain more if you were underweight before getting pregnant or if you are pregnant with more than one baby.  Gain less if you were overweight or obese when you got pregnant. Medicines and Vitamins  Take prenatal vitamins as directed by your health care provider. These include vitamins such as folic acid, iron, calcium, and vitamin D. They are important for healthy development.  Take medicines only as directed by your health care provider. Read labels and ask a pharmacist or your health care provider whether over-the-counter medicines, supplements, and prescription drugs are safe to take during pregnancy. Activities  Be physically active as advised by your health care provider. Ask your health care provider to recommend activities that are safe for you to do, such as walking or swimming.  Do not participate in strenuous or extreme sports. Lifestyle  Do not drink alcohol.  Do not use any tobacco products, including cigarettes, chewing tobacco, or electronic cigarettes. If you need help quitting, ask your health care provider.  Do not use illegal drugs. Safety  Avoid exposure to mercury, lead, or other heavy metals. Ask your health care provider about common sources of these heavy metals.  Avoid listeria infection during pregnancy. Follow these precautions:  Do not eat soft cheeses or deli meats.  Do not eat hot dogs unless they have been warmed up to the point of steaming, such as in the microwave oven.  Do not drink unpasteurized  milk.  Avoid toxoplasmosis infection during pregnancy. Follow these precautions:  Do not change your cat's litter box, if you have a cat. Ask someone else to do this for you.  Wear gardening gloves while working in the yard. General Instructions  Keep all follow-up visits as directed by your health care provider. This is important. This includes prenatal care and screening tests.  Manage any chronic health conditions. Work closely with your health care provider to keep conditions, such as diabetes, under control. How do I know if my baby is developing well? At each prenatal visit, your health care provider will do several different tests to check on your health and keep track of your baby's development. These include:  Fundal height.  Your health care provider will measure your growing belly from top to bottom using a tape measure.  Your health care provider will also feel your belly to determine your baby's position.  Heartbeat.  An ultrasound in the first trimester can   confirm pregnancy and show a heartbeat, depending on how far along you are.  Your health care provider will check your baby's heart rate at every prenatal visit.  As you get closer to your delivery date, you may have regular fetal heart rate monitoring to make sure that your baby is not in distress.  Second trimester ultrasound.  This ultrasound checks your baby's development. It also indicates your baby's gender. What should I do if I have concerns about my baby's development? Always talk with your health care provider about any concerns that you may have. This information is not intended to replace advice given to you by your health care provider. Make sure you discuss any questions you have with your health care provider. Document Released: 07/15/2007 Document Revised: 07/04/2015 Document Reviewed: 07/05/2013 Elsevier Interactive Patient Education  2017 [place] of Pregnancy The  second trimester is from week 13 through week 28, month 4 through 6. This is often the time in pregnancy that you feel your best. Often times, morning sickness has lessened or quit. You may have more energy, and you may get hungry more often. Your unborn baby (fetus) is growing rapidly. At the end of the sixth month, he or she is about 9 inches long and weighs about 1 pounds. You will likely feel the baby move (quickening) between 18 and 20 weeks of pregnancy. Follow these instructions at home:  Avoid all smoking, herbs, and alcohol. Avoid drugs not approved by your doctor.  Do not use any tobacco products, including cigarettes, chewing tobacco, and electronic cigarettes. If you need help quitting, ask your doctor. You may get counseling or other support to help you quit.  Only take medicine as told by your doctor. Some medicines are safe and some are not during pregnancy.  Exercise only as told by your doctor. Stop exercising if you start having cramps.  Eat regular, healthy meals.  Wear a good support bra if your breasts are tender.  Do not use hot tubs, steam rooms, or saunas.  Wear your seat belt when driving.  Avoid raw meat, uncooked cheese, and liter boxes and soil used by cats.  Take your prenatal vitamins.  Take 1500-2000 milligrams of calcium daily starting at the 20th week of pregnancy until you deliver your baby.  Try taking medicine that helps you poop (stool softener) as needed, and if your doctor approves. Eat more fiber by eating fresh fruit, vegetables, and whole grains. Drink enough fluids to keep your pee (urine) clear or pale yellow.  Take warm water baths (sitz baths) to soothe pain or discomfort caused by hemorrhoids. Use hemorrhoid cream if your doctor approves.  If you have puffy, bulging veins (varicose veins), wear support hose. Raise (elevate) your feet for 15 minutes, 3-4 times a day. Limit salt in your diet.  Avoid heavy lifting, wear low heals, and sit up  straight.  Rest with your legs raised if you have leg cramps or low back pain.  Visit your dentist if you have not gone during your pregnancy. Use a soft toothbrush to brush your teeth. Be gentle when you floss.  You can have sex (intercourse) unless your doctor tells you not to.  Go to your doctor visits. Get help if:  You feel dizzy.  You have mild cramps or pressure in your lower belly (abdomen).  You have a nagging pain in your belly area.  You continue to feel sick to your stomach (nauseous), throw up (vomit), or have  watery poop (diarrhea).  You have bad smelling fluid coming from your vagina.  You have pain with peeing (urination). Get help right away if:  You have a fever.  You are leaking fluid from your vagina.  You have spotting or bleeding from your vagina.  You have severe belly cramping or pain.  You lose or gain weight rapidly.  You have trouble catching your breath and have chest pain.  You notice sudden or extreme puffiness (swelling) of your face, hands, ankles, feet, or legs.  You have not felt the baby move in over an hour.  You have severe headaches that do not go away with medicine.  You have vision changes. This information is not intended to replace advice given to you by your health care provider. Make sure you discuss any questions you have with your health care provider. Document Released: 04/22/2009 Document Revised: 07/04/2015 Document Reviewed: 03/29/2012 Elsevier Interactive Patient Education  2017 [employer].

## 2016-06-26 NOTE — Progress Notes (Signed)
NEW OB HISTORY AND PHYSICAL  SUBJECTIVE:       Kim Wood is a 41 y.o. [phone] female, Patient's last menstrual period was 03/21/2016 (approximate)., Estimated Date of Delivery: 01/06/17, [redacted]w[redacted]d, presents today for establishment of Prenatal Care. She has no unusual complaints    Gynecologic History Patient's last menstrual period was 03/21/2016 (approximate). Abnormal Contraception: none Last Pap: 2016. Results were: normal  Obstetric History OB History  Gravida Para Term Preterm AB Living  4 2 2   1 2   SAB TAB Ectopic Multiple Live Births      1   2    # Outcome Date GA Lbr Len/2nd Weight Sex Delivery Anes PTL Lv  4 Current           3 Ectopic 2018          2 Term 2014    M CS-Unspec  Y LIV  1 Term 2012    M CS-Unspec  N LIV      No past medical history on file.  Past Surgical History:  Procedure Laterality Date  . CESAREAN SECTION     x2    Current Outpatient Prescriptions on File Prior to Visit  Medication Sig Dispense Refill  . Prenatal Vit-Fe Fumarate-FA (MULTIVITAMIN-PRENATAL) 27-0.8 MG TABS tablet Take 1 tablet by mouth daily at 12 noon.     No current facility-administered medications on file prior to visit.     No Known Allergies  Social History   Social History  . Marital status: Married    Spouse name: N/A  . Number of children: N/A  . Years of education: N/A   Occupational History  . Not on file.   Social History Main Topics  . Smoking status: Never Smoker  . Smokeless tobacco: Never Used  . Alcohol use No     Comment: 1 drink/month  . Drug use: No  . Sexual activity: Yes    Birth control/ protection: None   Other Topics Concern  . Not on file   Social History Narrative  . No narrative on file    Family History  Problem Relation Age of Onset  . Hypertension Father   . Seizures Brother   . Stroke Maternal Grandmother   . Rheum arthritis Paternal Grandmother   . Diabetes Paternal Grandmother     The following portions  of the patient's history were reviewed and updated as appropriate: allergies, current medications, past OB history, past medical history, past surgical history, past family history, past social history, and problem list.    OBJECTIVE: Initial Physical Exam (New OB)  GENERAL APPEARANCE: alert, well appearing, in no apparent distress, oriented to person, place and time HEAD: normocephalic, atraumatic MOUTH: mucous membranes moist, pharynx normal without lesions THYROID: no thyromegaly or masses present BREASTS: no masses noted, no significant tenderness, no palpable axillary nodes, no skin changes LUNGS: clear to auscultation, no wheezes, rales or rhonchi, symmetric air entry HEART: regular rate and rhythm, no murmurs ABDOMEN: soft, nontender, nondistended, no abnormal masses, no epigastric pain EXTREMITIES: no redness or tenderness in the calves or thighs SKIN: normal coloration and turgor, no rashes LYMPH NODES: no adenopathy palpable NEUROLOGIC: alert, oriented, normal speech, no focal findings or movement disorder noted  PELVIC EXAM EXTERNAL GENITALIA: normal appearing vulva with no masses, tenderness or lesions VAGINA: no abnormal discharge or lesions CERVIX: no lesions or cervical motion tenderness UTERUS: gravid ADNEXA: no masses palpable and nontender OB EXAM PELVIMETRY: appears adequate RECTUM: exam not indicated  ASSESSMENT: Normal pregnancy Advance Maternal Age in pregnancy   PLAN: New OB counseling: The patient has been given an overview regarding routine prenatal care. Recommendations regarding diet, weight gain, and exercise in pregnancy were given. Prenatal testing, optional genetic testing declined.  Ultrasound use in pregnancy were reviewed. Benefits of Breast Feeding were discussed. The patient is encouraged to consider nursing her baby post partum.  Deneise Lever Amaiah Cristiano,CNM

## 2016-07-23 ENCOUNTER — Encounter: Payer: Self-pay | Admitting: Certified Nurse Midwife

## 2016-07-23 ENCOUNTER — Ambulatory Visit (INDEPENDENT_AMBULATORY_CARE_PROVIDER_SITE_OTHER): Payer: BC Managed Care – PPO | Admitting: Certified Nurse Midwife

## 2016-07-23 VITALS — BP 124/74 | HR 97 | Wt 179.9 lb

## 2016-07-23 DIAGNOSIS — O09522 Supervision of elderly multigravida, second trimester: Secondary | ICD-10-CM

## 2016-07-23 LAB — POCT URINALYSIS DIPSTICK
Bilirubin, UA: NEGATIVE
Blood, UA: NEGATIVE
GLUCOSE UA: NEGATIVE
Leukocytes, UA: NEGATIVE
NITRITE UA: NEGATIVE
PH UA: 7 (ref 5.0–8.0)
PROTEIN UA: NEGATIVE
SPEC GRAV UA: 1.015 (ref 1.010–1.025)
Urobilinogen, UA: 0.2 E.U./dL

## 2016-07-23 NOTE — Progress Notes (Signed)
ROB-Pt doing well, reports "vaginally zingers" and vaginal pain. Discussed round ligament pain and home treatment measures including abdominal support. Reviewed practice policies and midwifery vs. Physician care, pt still deciding. Educations materials given regarding TOLAC. Reviewed red flag symptoms and when to call. RTC x 4 weeks for anatomy scan and ROB or sooner if needed.

## 2016-07-23 NOTE — Patient Instructions (Signed)
Round Ligament Pain The round ligament is a cord of muscle and tissue that helps to support the uterus. It can become a source of pain during pregnancy if it becomes stretched or twisted as the baby grows. The pain usually begins in the second trimester of pregnancy, and it can come and go until the baby is delivered. It is not a serious problem, and it does not cause harm to the baby. Round ligament pain is usually a short, sharp, and pinching pain, but it can also be a dull, lingering, and aching pain. The pain is felt in the lower side of the abdomen or in the groin. It usually starts deep in the groin and moves up to the outside of the hip area. Pain can occur with:  A sudden change in position.  Rolling over in bed.  Coughing or sneezing.  Physical activity.  Follow these instructions at home: Watch your condition for any changes. Take these steps to help with your pain:  When the pain starts, relax. Then try: ? Sitting down. ? Flexing your knees up to your abdomen. ? Lying on your side with one pillow under your abdomen and another pillow between your legs. ? Sitting in a warm bath for 15-20 minutes or until the pain goes away.  Take over-the-counter and prescription medicines only as told by your health care provider.  Move slowly when you sit and stand.  Avoid long walks if they cause pain.  Stop or lessen your physical activities if they cause pain.  Contact a health care provider if:  Your pain does not go away with treatment.  You feel pain in your back that you did not have before.  Your medicine is not helping. Get help right away if:  You develop a fever or chills.  You develop uterine contractions.  You develop vaginal bleeding.  You develop nausea or vomiting.  You develop diarrhea.  You have pain when you urinate. This information is not intended to replace advice given to you by your health care provider. Make sure you discuss any questions you have  with your health care provider. Document Released: 11/05/2007 Document Revised: 07/04/2015 Document Reviewed: 04/04/2014 Elsevier Interactive Patient Education  2018 [employer]. Trial of Labor After Cesarean Delivery A trial of labor after cesarean delivery (TOLAC) is when a woman tries to give birth vaginally after a previous cesarean delivery. TOLAC may be a safe and appropriate option for you depending on your medical history and other risk factors. When TOLAC is successful and you are able to have a vaginal delivery, this is called a vaginal birth after cesarean delivery (VBAC). Candidates for TOLAC TOLAC is possible for some women who:  Have undergone one or two prior cesarean deliveries in which the incision of the uterus was horizontal (low transverse).  Are carrying twins and have had one prior low transverse incision during a cesarean delivery.  Do not have a vertical (classical) uterine scar.  Have not had a tear in the wall of their uterus (uterine rupture).  TOLAC is also supported for women who meet appropriate criteria and:  Are under the age of 55 years.  Are tall and have a body mass index (BMI) of less than 30.  Have an unknown uterine scar.  Give birth in a facility equipped to handle an emergency cesarean delivery. This team should be able to handle possible complications such as a uterine rupture.  Have thorough counseling about the benefits and risks of TOLAC.  Have discussed future pregnancy plans with their health care provider.  Plan to have several more pregnancies.  Most successful candidates for TOLAC:  Have had a successful vaginal delivery before or after their cesarean delivery.  Experience labor that begins naturally on or before the due date (40 weeks of gestation).  Do not have a very large (macrosomic) baby.  Had a prior cesarean delivery but are not currently experiencing factors that would prompt a cesarean delivery (such as a breech  position).  Had only one prior cesarean delivery.  Had a prior cesarean delivery that was performed early in labor and not after full cervical dilation. TOLAC may be most appropriate for women who meet the above guidelines and who plan to have more pregnancies. TOLAC is not recommended for home births. Least successful candidates for TOLAC:  Have an induced labor with an unfavorable cervix. An unfavorable cervix is when the cervix is not dilating enough (among other factors).  Have never had a vaginal delivery.  Have had more than two cesarean deliveries.  Have a pregnancy at more than 40 weeks of gestation.  Are pregnant with a baby with a suspected weight greater than 4,000 grams (8 pounds) and who have no prior history of a vaginal delivery.  Have closely spaced pregnancies. Suggested benefits of TOLAC  You may have a faster recovery time.  You may have a shorter stay in the hospital.  You may have less pain and fewer problems than with a cesarean delivery. Women who have a cesarean delivery have a higher chance of needing blood or getting a fever, an infection, or a blood clot in the legs. Suggested risks of TOLAC The highest risk of complications happens to women who attempt a TOLAC and fail. A failed TOLAC results in an unplanned cesarean delivery. Risks related to [facility] or repeat cesarean deliveries include:  Blood loss.  Infection.  Blood clot.  Injury to surrounding tissues or organs.  Having to remove the uterus (hysterectomy).  Potential problems with the placenta (such as placenta previa or placenta accreta) in future pregnancies.  Although very rare, the main concerns with TOLAC are:  Rupture of the uterine scar from a past cesarean delivery.  Needing an emergency cesarean delivery.  Having a bad outcome for the baby (perinatal morbidity).  Where to find more information:  American Congress of Obstetricians and Gynecologists: www.acog.[place]: www.midwife.org This information is not intended to replace advice given to you by your health care provider. Make sure you discuss any questions you have with your health care provider. Document Released: 10/14/2010 Document Revised: 12/25/2015 Document Reviewed: 07/18/2012 Elsevier Interactive Patient Education  [employer].

## 2016-08-19 ENCOUNTER — Encounter: Payer: Self-pay | Admitting: Obstetrics and Gynecology

## 2016-08-19 ENCOUNTER — Ambulatory Visit (INDEPENDENT_AMBULATORY_CARE_PROVIDER_SITE_OTHER): Payer: BC Managed Care – PPO | Admitting: Obstetrics and Gynecology

## 2016-08-19 ENCOUNTER — Ambulatory Visit (INDEPENDENT_AMBULATORY_CARE_PROVIDER_SITE_OTHER): Payer: BC Managed Care – PPO

## 2016-08-19 VITALS — BP 122/74 | HR 81 | Wt 182.2 lb

## 2016-08-19 DIAGNOSIS — O4442 Low lying placenta NOS or without hemorrhage, second trimester: Secondary | ICD-10-CM

## 2016-08-19 DIAGNOSIS — O09522 Supervision of elderly multigravida, second trimester: Secondary | ICD-10-CM | POA: Diagnosis not present

## 2016-08-19 DIAGNOSIS — Z3492 Encounter for supervision of normal pregnancy, unspecified, second trimester: Secondary | ICD-10-CM

## 2016-08-19 LAB — POCT URINALYSIS DIPSTICK
BILIRUBIN UA: NEGATIVE
Blood, UA: NEGATIVE
Glucose, UA: NEGATIVE
KETONES UA: NEGATIVE
LEUKOCYTES UA: NEGATIVE
Nitrite, UA: NEGATIVE
PH UA: 6 (ref 5.0–8.0)
Protein, UA: NEGATIVE
SPEC GRAV UA: 1.01 (ref 1.010–1.025)
Urobilinogen, UA: 0.2 E.U./dL

## 2016-08-19 NOTE — Progress Notes (Signed)
ROB- anatomy scan done today,pt doesn't want to know the sex of the baby

## 2016-08-19 NOTE — Progress Notes (Signed)
ROB & anatomy scan-   Indications:Anatomy U/S Findings:  Singleton intrauterine pregnancy is visualized with FHR at 150 BPM. Biometrics give an (U/S) Gestational age of 38 3/7 weeks and an (U/S) EDD of 01/03/17; this correlates with the clinically established EDD of 12/26/16.  Fetal presentation is Vertex.  EFW: 363g (13oz). Placenta: Posterior, grade 0, .7cm from internal os (low lying). AFI: Adequate with MVP of 4.4 cm.  Anatomic survey is complete and normal; Gender - female  . (DOES NOT want to know gender at this time)  Ovaries are not seen. Survey of the adnexa demonstrates no adnexal masses. There is no free peritoneal fluid in the cul de sac.  Impression: 1. 20 3/7 week Viable Singleton Intrauterine pregnancy by U/S. 2. (U/S) EDD is consistent with Clinically established (LMP) EDD of 12/26/16. 3. Normal Anatomy Scan 4. Low lying placenta  Counseled on LLP and precautions.

## 2016-09-15 ENCOUNTER — Ambulatory Visit (INDEPENDENT_AMBULATORY_CARE_PROVIDER_SITE_OTHER): Payer: BC Managed Care – PPO | Admitting: Certified Nurse Midwife

## 2016-09-15 ENCOUNTER — Encounter: Payer: Self-pay | Admitting: Certified Nurse Midwife

## 2016-09-15 VITALS — BP 119/74 | HR 91 | Wt 191.0 lb

## 2016-09-15 DIAGNOSIS — Z3492 Encounter for supervision of normal pregnancy, unspecified, second trimester: Secondary | ICD-10-CM

## 2016-09-15 DIAGNOSIS — Z131 Encounter for screening for diabetes mellitus: Secondary | ICD-10-CM

## 2016-09-15 DIAGNOSIS — O4440 Low lying placenta NOS or without hemorrhage, unspecified trimester: Secondary | ICD-10-CM

## 2016-09-15 DIAGNOSIS — Z130 Encounter for screening for diseases of the blood and blood-forming organs and certain disorders involving the immune mechanism: Secondary | ICD-10-CM

## 2016-09-15 LAB — POCT URINALYSIS DIPSTICK
Bilirubin, UA: NEGATIVE
GLUCOSE UA: NEGATIVE
Ketones, UA: NEGATIVE
Leukocytes, UA: NEGATIVE
NITRITE UA: NEGATIVE
PROTEIN UA: NEGATIVE
RBC UA: NEGATIVE
SPEC GRAV UA: 1.01 (ref 1.010–1.025)
UROBILINOGEN UA: 0.2 U/dL
pH, UA: 6.5 (ref 5.0–8.0)

## 2016-09-15 NOTE — Patient Instructions (Addendum)
[place] 2018 Prenatal Education Class Schedule Register at [email or web address] in the [place] or call Kerman Passey at [phone] 9:00a-5:00p M-F  Childbirth Preparation Certified Childbirth Educators teach this 5 week course.  Expectant parents are encouraged to take this class in their 3rd trimester, completing it by their 35-36th week. Meets in [facility], Lower Level.  Mondays Thursdays  7:00-9:00 p 7:00-9:00 p  July 23 - August 20 July 19 - August 16  September 17 - October 15 September 6 -October 4  November 5 - December 3 October 25 - November 29   No Class on Thanksgiving Day -November 22  Childbirth Preparation Refresher Course For those who have previously attended Prepared Childbirth Preparation classes, this class in incorporated into the 3rd and 4th classes in the Monday night childbirth series.  Course meets in the [facility]. Lower Level from 7:00p - 9:00p  August 6 & 13  October 1 & 8  November 19 & 26   Weekend Childbirth Waymon Amato Classes are held Saturday & Sunday, 1:00 5:00p Course meets in [facility] [place] Level  August 4 & 5  November 3 & 4    The BirthPlace Tours Free tours are held on the third Sunday of each month at 3 pm.  The tour meets in the third floor waiting area and will take approximately 30 minutes.  Tours are also included in Childbirth class series as well as Brother/Sister class.  An online virtual tour can be seen at [email or web address].         Breastfeeding & Infant Nutrition The course incorporates returning to work or school.  Breast milk collection and storage with basic breastfeeding and infant nutrition. This two-class course is held the 2nd and 3rd Tuesday of each month from 7:00 -9:00 pm.  Course meets in the [facility] Lower level  June 12 & 19 July-No Class  August 14 & 21 September 11 &18  September 11 & 18 October 9 &16  November 13 &  20 December 11 & 18   [employer] welcomes any mother for a social outing with other Moms to share experiences and challenges in an informal setting.  Meets the 1st Thursday and 3rd Thursday 11:30a-1:00 pm of each month in [facility] 3rd floor classroom.  No registration required.  Newborn Essentials This course covers bathing, diapering, swaddling and more with practice on lifelike dolls.  Participants will also learn safety tips and infant CPR (Not for certification).  It is held the 1st Wednesday of each month from 7:00p-9:00p in the [facility], Lower level.  June 6 July- No Class  August 1 September 5  October 3 November 7  December 7    Preparing Big Brother & Sister This one session course prepares children and their parents for the arrival of a new baby.  It is held on the 1st Tuesday of each month from 6:30p - 8:00p. Course meets in the [facility], Lower level.  July-No Class August 7  September 4 October 2  November 6 December 4   [place] for [employer] Dads This nationally acclaimed class helps expecting and new dads with the basic skills and confidence to bond with their infants, support their mates, and provide a safe and healthy home environment for their new family. Classes are held the 2nd Saturday of every month from 9:00a - 12:00 noon.  Course meets in the [facility] Lower level.  June 9 August 11  October 13 No Class in December  Round Ligament Pain The round ligament is a cord of muscle and tissue that helps to support the uterus. It can become a source of pain during pregnancy if it becomes stretched or twisted as the baby grows. The pain usually begins in the second trimester of pregnancy, and it can come and go until the baby is delivered. It is not a serious problem, and it does not cause harm to the baby. Round ligament pain is usually a short, sharp, and pinching pain, but it can also be a dull, lingering, and aching pain. The pain  is felt in the lower side of the abdomen or in the groin. It usually starts deep in the groin and moves up to the outside of the hip area. Pain can occur with:  A sudden change in position.  Rolling over in bed.  Coughing or sneezing.  Physical activity.  Follow these instructions at home: Watch your condition for any changes. Take these steps to help with your pain:  When the pain starts, relax. Then try: ? Sitting down. ? Flexing your knees up to your abdomen. ? Lying on your side with one pillow under your abdomen and another pillow between your legs. ? Sitting in a warm bath for 15-20 minutes or until the pain goes away.  Take over-the-counter and prescription medicines only as told by your health care provider.  Move slowly when you sit and stand.  Avoid long walks if they cause pain.  Stop or lessen your physical activities if they cause pain.  Contact a health care provider if:  Your pain does not go away with treatment.  You feel pain in your back that you did not have before.  Your medicine is not helping. Get help right away if:  You develop a fever or chills.  You develop uterine contractions.  You develop vaginal bleeding.  You develop nausea or vomiting.  You develop diarrhea.  You have pain when you urinate. This information is not intended to replace advice given to you by your health care provider. Make sure you discuss any questions you have with your health care provider. Document Released: 11/05/2007 Document Revised: 07/04/2015 Document Reviewed: 04/04/2014 Elsevier Interactive Patient Education  2018 [employer]. Trial of Labor After Cesarean Delivery A trial of labor after cesarean delivery (TOLAC) is when a woman tries to give birth vaginally after a previous cesarean delivery. TOLAC may be a safe and appropriate option for you depending on your medical history and other risk factors. When TOLAC is successful and you are able to have a  vaginal delivery, this is called a vaginal birth after cesarean delivery (VBAC). Candidates for TOLAC TOLAC is possible for some women who:  Have undergone one or two prior cesarean deliveries in which the incision of the uterus was horizontal (low transverse).  Are carrying twins and have had one prior low transverse incision during a cesarean delivery.  Do not have a vertical (classical) uterine scar.  Have not had a tear in the wall of their uterus (uterine rupture).  TOLAC is also supported for women who meet appropriate criteria and:  Are under the age of 66 years.  Are tall and have a body mass index (BMI) of less than 30.  Have an unknown uterine scar.  Give birth in a facility equipped to handle an emergency cesarean delivery. This team should be able to handle possible complications such as a  uterine rupture.  Have thorough counseling about the benefits and risks of TOLAC.  Have discussed future pregnancy plans with their health care provider.  Plan to have several more pregnancies.  Most successful candidates for TOLAC:  Have had a successful vaginal delivery before or after their cesarean delivery.  Experience labor that begins naturally on or before the due date (40 weeks of gestation).  Do not have a very large (macrosomic) baby.  Had a prior cesarean delivery but are not currently experiencing factors that would prompt a cesarean delivery (such as a breech position).  Had only one prior cesarean delivery.  Had a prior cesarean delivery that was performed early in labor and not after full cervical dilation. TOLAC may be most appropriate for women who meet the above guidelines and who plan to have more pregnancies. TOLAC is not recommended for home births. Least successful candidates for TOLAC:  Have an induced labor with an unfavorable cervix. An unfavorable cervix is when the cervix is not dilating enough (among other factors).  Have never had a vaginal  delivery.  Have had more than two cesarean deliveries.  Have a pregnancy at more than 40 weeks of gestation.  Are pregnant with a baby with a suspected weight greater than 4,000 grams (8 pounds) and who have no prior history of a vaginal delivery.  Have closely spaced pregnancies. Suggested benefits of TOLAC  You may have a faster recovery time.  You may have a shorter stay in the hospital.  You may have less pain and fewer problems than with a cesarean delivery. Women who have a cesarean delivery have a higher chance of needing blood or getting a fever, an infection, or a blood clot in the legs. Suggested risks of TOLAC The highest risk of complications happens to women who attempt a TOLAC and fail. A failed TOLAC results in an unplanned cesarean delivery. Risks related to [facility] or repeat cesarean deliveries include:  Blood loss.  Infection.  Blood clot.  Injury to surrounding tissues or organs.  Having to remove the uterus (hysterectomy).  Potential problems with the placenta (such as placenta previa or placenta accreta) in future pregnancies.  Although very rare, the main concerns with TOLAC are:  Rupture of the uterine scar from a past cesarean delivery.  Needing an emergency cesarean delivery.  Having a bad outcome for the baby (perinatal morbidity).  Where to find more information:  American Congress of Obstetricians and Gynecologists: www.acog.[place]: www.midwife.org This information is not intended to replace advice given to you by your health care provider. Make sure you discuss any questions you have with your health care provider. Document Released: 10/14/2010 Document Revised: 12/25/2015 Document Reviewed: 07/18/2012 Elsevier Interactive Patient Education  [employer].

## 2016-09-17 DIAGNOSIS — O4440 Low lying placenta NOS or without hemorrhage, unspecified trimester: Secondary | ICD-10-CM | POA: Insufficient documentation

## 2016-09-17 NOTE — Progress Notes (Signed)
ROB-Pt doing well

## 2016-09-17 NOTE — Progress Notes (Signed)
ROB-Pt doing well. Secret gender reveal planned by husband is scheduled for this weekend. Repeat ultrasound next visit for low lying placenta. Education regarding 28 week labs and course of prenatal care. Reviewed risk and benefits associated with TOLAC verses ERCS. Informed patient that she is able to change her mind about TOLAC and still remain a midwife patient. Reviewed red flag symptoms and when to call. RTC x 4 weeks for follow up US, 28 week labs including glucola, and ROB.

## 2016-10-13 ENCOUNTER — Ambulatory Visit: Payer: BC Managed Care – PPO

## 2016-10-13 ENCOUNTER — Ambulatory Visit (INDEPENDENT_AMBULATORY_CARE_PROVIDER_SITE_OTHER): Payer: BC Managed Care – PPO | Admitting: Certified Nurse Midwife

## 2016-10-13 DIAGNOSIS — Z131 Encounter for screening for diabetes mellitus: Secondary | ICD-10-CM

## 2016-10-13 DIAGNOSIS — Z130 Encounter for screening for diseases of the blood and blood-forming organs and certain disorders involving the immune mechanism: Secondary | ICD-10-CM

## 2016-10-13 DIAGNOSIS — Z3492 Encounter for supervision of normal pregnancy, unspecified, second trimester: Secondary | ICD-10-CM | POA: Diagnosis not present

## 2016-10-13 DIAGNOSIS — Z23 Encounter for immunization: Secondary | ICD-10-CM

## 2016-10-13 DIAGNOSIS — O4440 Low lying placenta NOS or without hemorrhage, unspecified trimester: Secondary | ICD-10-CM

## 2016-10-13 LAB — POCT URINALYSIS DIPSTICK
BILIRUBIN UA: NEGATIVE
Blood, UA: NEGATIVE
Glucose, UA: NEGATIVE
KETONES UA: NEGATIVE
LEUKOCYTES UA: NEGATIVE
Nitrite, UA: NEGATIVE
Protein, UA: NEGATIVE
SPEC GRAV UA: 1.015 (ref 1.010–1.025)
Urobilinogen, UA: 0.2 E.U./dL
pH, UA: 6 (ref 5.0–8.0)

## 2016-10-13 NOTE — Progress Notes (Signed)
Kim Wood, doing well. No complaints discussed GTT, CBC, Tdap , and CBC . Reviewed that she will see MD vor VBAC counseling @ 32 wks. She agreed to plan, Will follow up with results of 1 hr GTT.  Ultrasound today for placenta placement :Posterior, grade 0, 3.5 cm from internal os.( see below for full report)  Cord blood public/priavte donation reviewed. Kim Wood 2 wks.    Philip Aspen, CNM  ULTRASOUND REPORT  Location: [facility] Date of Service: 10/13/16  Indications:LLP Findings:  Kim Wood intrauterine pregnancy is visualized with FHR at 144 BPM. Biometrics give an (U/S) Gestational age of 70 4/7 weeks and an (U/S) EDD of 01/01/17; this correlates with the clinically established EDD of 12/26/16.  Fetal presentation is Vertex.  EFW: 1241g (2lb 12oz) Williams 35 percentile. Placenta: Posterior, grade 0, 3.5 cm from internal os. AFI: 16.8 cm.  Fetal stomach and bladder are seen.  Impression: 1. 28 4/7 week Viable Singleton Intrauterine pregnancy by U/S. 2. (U/S) EDD is consistent with Clinically established (LMP) EDD of 12/26/16. 3. Placenta is 3.5 cm from internal os.  Recommendations: 1.Clinical correlation with the patient's History and Physical Exam.   Tyrone Apple

## 2016-10-13 NOTE — Patient Instructions (Signed)
Tdap Vaccine (Tetanus, Diphtheria and Pertussis): What You Need to Know 1. Why get vaccinated? Tetanus, diphtheria and pertussis are very serious diseases. Tdap vaccine can protect us from these diseases. And, Tdap vaccine given to pregnant women can protect newborn babies against pertussis. TETANUS (Lockjaw) is rare in the [place] today. It causes painful muscle tightening and stiffness, usually all over the body.  It can lead to tightening of muscles in the head and neck so you can't open your mouth, swallow, or sometimes even breathe. Tetanus kills about 1 out of 10 people who are infected even after receiving the best medical care.  DIPHTHERIA is also rare in the [place] today. It can cause a thick coating to form in the back of the throat.  It can lead to breathing problems, heart failure, paralysis, and death.  PERTUSSIS (Whooping Cough) causes severe coughing spells, which can cause difficulty breathing, vomiting and disturbed sleep.  It can also lead to weight loss, incontinence, and rib fractures. Up to 2 in 100 adolescents and 5 in 100 adults with pertussis are hospitalized or have complications, which could include pneumonia or death.  These diseases are caused by bacteria. Diphtheria and pertussis are spread from person to person through secretions from coughing or sneezing. Tetanus enters the body through cuts, scratches, or wounds. Before vaccines, as many as 200,000 cases of diphtheria, 200,000 cases of pertussis, and hundreds of cases of tetanus, were reported in the [place] each year. Since vaccination began, reports of cases for tetanus and diphtheria have dropped by about 99% and for pertussis by about 80%. 2. Tdap vaccine Tdap vaccine can protect adolescents and adults from tetanus, diphtheria, and pertussis. One dose of Tdap is routinely given at age 11 or 12. People who did not get Tdap at that age should get it as soon as possible. Tdap is especially  important for healthcare professionals and anyone having close contact with a baby younger than 12 months. Pregnant women should get a dose of Tdap during every pregnancy, to protect the newborn from pertussis. Infants are most at risk for severe, life-threatening complications from pertussis. Another vaccine, called Td, protects against tetanus and diphtheria, but not pertussis. A Td booster should be given every 10 years. Tdap may be given as one of these boosters if you have never gotten Tdap before. Tdap may also be given after a severe cut or burn to prevent tetanus infection. Your doctor or the person giving you the vaccine can give you more information. Tdap may safely be given at the same time as other vaccines. 3. Some people should not get this vaccine  A person who has ever had a life-threatening allergic reaction after a previous dose of any diphtheria, tetanus or pertussis containing vaccine, OR has a severe allergy to any part of this vaccine, should not get Tdap vaccine. Tell the person giving the vaccine about any severe allergies.  Anyone who had coma or long repeated seizures within 7 days after a childhood dose of DTP or DTaP, or a previous dose of Tdap, should not get Tdap, unless a cause other than the vaccine was found. They can still get Td.  Talk to your doctor if you: ? have seizures or another nervous system problem, ? had severe pain or swelling after any vaccine containing diphtheria, tetanus or pertussis, ? ever had a condition called Guillain-Barr Syndrome (GBS), ? aren't feeling well on the day the shot is scheduled. 4. Risks With any medicine, including   vaccines, there is a chance of side effects. These are usually mild and go away on their own. Serious reactions are also possible but are rare. Most people who get Tdap vaccine do not have any problems with it. Mild problems following Tdap: (Did not interfere with activities)  Pain where the shot was given (about  3 in 4 adolescents or 2 in 3 adults)  Redness or swelling where the shot was given (about 1 person in 5)  Mild fever of at least 100.4F (up to about 1 in 25 adolescents or 1 in 100 adults)  Headache (about 3 or 4 people in 10)  Tiredness (about 1 person in 3 or 4)  Nausea, vomiting, diarrhea, stomach ache (up to 1 in 4 adolescents or 1 in 10 adults)  Chills, sore joints (about 1 person in 10)  Body aches (about 1 person in 3 or 4)  Rash, swollen glands (uncommon)  Moderate problems following Tdap: (Interfered with activities, but did not require medical attention)  Pain where the shot was given (up to 1 in 5 or 6)  Redness or swelling where the shot was given (up to about 1 in 16 adolescents or 1 in 12 adults)  Fever over 102F (about 1 in 100 adolescents or 1 in 250 adults)  Headache (about 1 in 7 adolescents or 1 in 10 adults)  Nausea, vomiting, diarrhea, stomach ache (up to 1 or 3 people in 100)  Swelling of the entire arm where the shot was given (up to about 1 in 500).  Severe problems following Tdap: (Unable to perform usual activities; required medical attention)  Swelling, severe pain, bleeding and redness in the arm where the shot was given (rare).  Problems that could happen after any vaccine:  People sometimes faint after a medical procedure, including vaccination. Sitting or lying down for about 15 minutes can help prevent fainting, and injuries caused by a fall. Tell your doctor if you feel dizzy, or have vision changes or ringing in the ears.  Some people get severe pain in the shoulder and have difficulty moving the arm where a shot was given. This happens very rarely.  Any medication can cause a severe allergic reaction. Such reactions from a vaccine are very rare, estimated at fewer than 1 in a million doses, and would happen within a few minutes to a few hours after the vaccination. As with any medicine, there is a very remote chance of a vaccine  causing a serious injury or death. The safety of vaccines is always being monitored. For more information, visit: www.cdc.gov/vaccinesafety/ 5. What if there is a serious problem? What should I look for? Look for anything that concerns you, such as signs of a severe allergic reaction, very high fever, or unusual behavior. Signs of a severe allergic reaction can include hives, swelling of the face and throat, difficulty breathing, a fast heartbeat, dizziness, and weakness. These would usually start a few minutes to a few hours after the vaccination. What should I do?  If you think it is a severe allergic reaction or other emergency that can't wait, call 9-1-1 or get the person to the nearest hospital. Otherwise, call your doctor.  Afterward, the reaction should be reported to the Vaccine Adverse Event Reporting System (VAERS). Your doctor might file this report, or you can do it yourself through the VAERS web site at www.vaers.hhs.gov, or by calling 1-800-822-7967. ? VAERS does not give medical advice. 6. The National Vaccine Injury Compensation Program The National   Vaccine Injury Compensation Program (VICP) is a federal program that was created to compensate people who may have been injured by certain vaccines. Persons who believe they may have been injured by a vaccine can learn about the program and about filing a claim by calling 1-800-338-2382 or visiting the VICP website at www.hrsa.gov/vaccinecompensation. There is a time limit to file a claim for compensation. 7. How can I learn more?  Ask your doctor. He or she can give you the vaccine package insert or suggest other sources of information.  Call your local or state health department.  Contact the Centers for Disease Control and Prevention (CDC): ? Call 1-800-232-4636 (1-800-CDC-INFO) or ? Visit CDC's website at www.cdc.gov/vaccines CDC Tdap Vaccine VIS (04/04/13) This information is not intended to replace advice given to you by your  health care provider. Make sure you discuss any questions you have with your health care provider. Document Released: 07/28/2011 Document Revised: 10/17/2015 Document Reviewed: 10/17/2015 Elsevier Interactive Patient Education  2017 Elsevier Inc.  

## 2016-10-14 LAB — CBC WITH DIFFERENTIAL/PLATELET
BASOS ABS: 0 10*3/uL (ref 0.0–0.2)
Basos: 1 %
EOS (ABSOLUTE): 0.1 10*3/uL (ref 0.0–0.4)
Eos: 2 %
Hematocrit: 33.7 % — ABNORMAL LOW (ref 34.0–46.6)
Hemoglobin: 11.1 g/dL (ref 11.1–15.9)
IMMATURE GRANULOCYTES: 0 %
Immature Grans (Abs): 0 10*3/uL (ref 0.0–0.1)
LYMPHS ABS: 1.3 10*3/uL (ref 0.7–3.1)
Lymphs: 27 %
MCH: 27.8 pg (ref 26.6–33.0)
MCHC: 32.9 g/dL (ref 31.5–35.7)
MCV: 85 fL (ref 79–97)
MONOCYTES: 7 %
MONOS ABS: 0.3 10*3/uL (ref 0.1–0.9)
NEUTROS PCT: 63 %
Neutrophils Absolute: 3 10*3/uL (ref 1.4–7.0)
Platelets: 241 10*3/uL (ref 150–379)
RBC: 3.99 x10E6/uL (ref 3.77–5.28)
RDW: 14.3 % (ref 12.3–15.4)
WBC: 4.7 10*3/uL (ref 3.4–10.8)

## 2016-10-14 LAB — GLUCOSE, 1 HOUR GESTATIONAL: GESTATIONAL DIABETES SCREEN: 79 mg/dL (ref 65–139)

## 2016-10-15 NOTE — Progress Notes (Signed)
Please contact patient glucola and CBC normal. Encourage to activate My Chart. Thanks, JML

## 2016-10-27 ENCOUNTER — Ambulatory Visit (INDEPENDENT_AMBULATORY_CARE_PROVIDER_SITE_OTHER): Payer: BC Managed Care – PPO | Admitting: Certified Nurse Midwife

## 2016-10-27 VITALS — BP 126/77 | HR 78 | Wt 194.1 lb

## 2016-10-27 DIAGNOSIS — Z3493 Encounter for supervision of normal pregnancy, unspecified, third trimester: Secondary | ICD-10-CM

## 2016-10-27 LAB — POCT URINALYSIS DIPSTICK
BILIRUBIN UA: NEGATIVE
Glucose, UA: NEGATIVE
KETONES UA: NEGATIVE
Leukocytes, UA: NEGATIVE
Nitrite, UA: NEGATIVE
PROTEIN UA: NEGATIVE
RBC UA: NEGATIVE
SPEC GRAV UA: 1.01 (ref 1.010–1.025)
Urobilinogen, UA: 0.2 E.U./dL
pH, UA: 6.5 (ref 5.0–8.0)

## 2016-10-27 NOTE — Progress Notes (Signed)
Kim Wood-Pt doing well. Gender reveal hosted by husband, they are having a girl. Reviewed red flag symptoms and when to call. RTC x 2 weeks for Kim Wood and VBAC consultation with Dr. Marcelline Mates then back to midwifery service line.

## 2016-10-27 NOTE — Patient Instructions (Signed)
Trial of Labor After Cesarean Delivery A trial of labor after cesarean delivery (TOLAC) is when a woman tries to give birth vaginally after a previous cesarean delivery. TOLAC may be a safe and appropriate option for you depending on your medical history and other risk factors. When TOLAC is successful and you are able to have a vaginal delivery, this is called a vaginal birth after cesarean delivery (VBAC). Candidates for TOLAC TOLAC is possible for some women who:  Have undergone one or two prior cesarean deliveries in which the incision of the uterus was horizontal (low transverse).  Are carrying twins and have had one prior low transverse incision during a cesarean delivery.  Do not have a vertical (classical) uterine scar.  Have not had a tear in the wall of their uterus (uterine rupture).  TOLAC is also supported for women who meet appropriate criteria and:  Are under the age of 65 years.  Are tall and have a body mass index (BMI) of less than 30.  Have an unknown uterine scar.  Give birth in a facility equipped to handle an emergency cesarean delivery. This team should be able to handle possible complications such as a uterine rupture.  Have thorough counseling about the benefits and risks of TOLAC.  Have discussed future pregnancy plans with their health care provider.  Plan to have several more pregnancies.  Most successful candidates for TOLAC:  Have had a successful vaginal delivery before or after their cesarean delivery.  Experience labor that begins naturally on or before the due date (40 weeks of gestation).  Do not have a very large (macrosomic) baby.  Had a prior cesarean delivery but are not currently experiencing factors that would prompt a cesarean delivery (such as a breech position).  Had only one prior cesarean delivery.  Had a prior cesarean delivery that was performed early in labor and not after full cervical dilation. TOLAC may be most appropriate  for women who meet the above guidelines and who plan to have more pregnancies. TOLAC is not recommended for home births. Least successful candidates for TOLAC:  Have an induced labor with an unfavorable cervix. An unfavorable cervix is when the cervix is not dilating enough (among other factors).  Have never had a vaginal delivery.  Have had more than two cesarean deliveries.  Have a pregnancy at more than 40 weeks of gestation.  Are pregnant with a baby with a suspected weight greater than 4,000 grams (8 pounds) and who have no prior history of a vaginal delivery.  Have closely spaced pregnancies. Suggested benefits of TOLAC  You may have a faster recovery time.  You may have a shorter stay in the hospital.  You may have less pain and fewer problems than with a cesarean delivery. Women who have a cesarean delivery have a higher chance of needing blood or getting a fever, an infection, or a blood clot in the legs. Suggested risks of TOLAC The highest risk of complications happens to women who attempt a TOLAC and fail. A failed TOLAC results in an unplanned cesarean delivery. Risks related to [facility] or repeat cesarean deliveries include:  Blood loss.  Infection.  Blood clot.  Injury to surrounding tissues or organs.  Having to remove the uterus (hysterectomy).  Potential problems with the placenta (such as placenta previa or placenta accreta) in future pregnancies.  Although very rare, the main concerns with TOLAC are:  Rupture of the uterine scar from a past cesarean delivery.  Needing an emergency  cesarean delivery.  Having a bad outcome for the baby (perinatal morbidity).  Where to find more information:  American Congress of Obstetricians and Gynecologists: www.acog.org  American College of Nurse-Midwives: www.midwife.org This information is not intended to replace advice given to you by your health care provider. Make sure you discuss any questions you have with  your health care provider. Document Released: 10/14/2010 Document Revised: 12/25/2015 Document Reviewed: 07/18/2012 Elsevier Interactive Patient Education  2018 Elsevier Inc.  

## 2016-10-27 NOTE — Progress Notes (Signed)
ROB- pt is doing well, denies any complaints 

## 2016-11-10 ENCOUNTER — Encounter: Payer: Self-pay | Admitting: Obstetrics and Gynecology

## 2016-11-10 ENCOUNTER — Ambulatory Visit (INDEPENDENT_AMBULATORY_CARE_PROVIDER_SITE_OTHER): Payer: BC Managed Care – PPO | Admitting: Obstetrics and Gynecology

## 2016-11-10 VITALS — BP 116/74 | HR 98 | Wt 197.4 lb

## 2016-11-10 DIAGNOSIS — Z23 Encounter for immunization: Secondary | ICD-10-CM

## 2016-11-10 DIAGNOSIS — Z98891 History of uterine scar from previous surgery: Secondary | ICD-10-CM

## 2016-11-10 DIAGNOSIS — O09523 Supervision of elderly multigravida, third trimester: Secondary | ICD-10-CM

## 2016-11-10 DIAGNOSIS — Z3493 Encounter for supervision of normal pregnancy, unspecified, third trimester: Secondary | ICD-10-CM

## 2016-11-10 LAB — POCT URINALYSIS DIPSTICK
Bilirubin, UA: NEGATIVE
GLUCOSE UA: NEGATIVE
Leukocytes, UA: NEGATIVE
Nitrite, UA: NEGATIVE
RBC UA: NEGATIVE
SPEC GRAV UA: 1.02 (ref 1.010–1.025)
UROBILINOGEN UA: 0.2 U/dL
pH, UA: 6 (ref 5.0–8.0)

## 2016-11-10 NOTE — Progress Notes (Signed)
ROB: Patient doing well, no complaints today. Declines flu vaccine today.  Discussed cord blood banking at patient's request.  Given handout. Presents from midwifery service for [facility] counseling.   Patient currently with h/o prior c-section x 2 (initial for placenta previa at 37 weeks, second was due to elective repeat (patient notes she wasn't really informed regarding TOLAC in her last pregnancy)).  Patient currently wants to discuss both options (TOLAC vs ERCS) as she is still on the fence. Discussion of risks, benefits of TOLAC vs ERCS noted below:  Risk of uterine rupture at term is 0.78 percent with TOLAC and 0.22 percent with ERCD. 1 in 10 uterine ruptures will result in neonatal death or neurological injury. The benefits of a trial of labor after cesarean (TOLAC) resulting in a vaginal birth after cesarean (VBAC) include the following: shorter length of hospital stay and postpartum recovery (in most cases); fewer complications, such as postpartum fever, wound or uterine infection, thromboembolism (blood clots in the leg or lung), need for blood transfusion and fewer neonatal breathing problems. The risks of an attempted VBAC or TOLAC include the following: Risk of failed trial of labor after cesarean (TOLAC) without a vaginal birth after cesarean (VBAC) resulting in repeat cesarean delivery (RCD) in about 20 to 42 percent of women who attempt VBAC.  Her individualized success rate using the MFMU VBAC risk calculator is 37.5%.   Previous studies have note an increased risk of maternal and fetal morbidity for patient attempting a trial of labor whose success rate is <70% compared to ERCS, while morbidity was similar for patient attempting a trial of labor vs ERCS with succes rates >70%.  Risk of rupture of uterus resulting in an emergency cesarean delivery. The risk of uterine rupture may be related in part to the type of uterine incision made during the first cesarean delivery. A previous transverse  uterine incision has the lowest risk of rupture (0.2 to 1.5 percent risk). Vertical or T-shaped uterine incisions have a higher risk of uterine rupture (4 to 9 percent risk)The risk of fetal death is very low with both VBAC and elective repeat cesarean delivery (ERCD), but the likelihood of fetal death is higher with VBAC than with ERCD. Maternal death is very rare with either type of delivery. ("Can a prediction mode for vaginal birth after cesarean section also predict the probability of morbidity related to a trial of labor?" American Journal of Obstetric and Gynecology 2009  January).  The risks of an elective repeat cesarean delivery (ERCD) were reviewed with the patient including but not limited to: 03/998 risk of uterine rupture which could have serious consequences, bleeding which may require transfusion; infection which may require antibiotics; injury to bowel, bladder or other surrounding organs (bowel, bladder, ureters); injury to the fetus; need for additional procedures including hysterectomy in the event of a life-threatening hemorrhage; thromboembolic phenomenon; abnormal placentation; incisional problems; death and other postoperative or anesthesia complications.    In addition we discussed that our collective office practice is to allow patient's who desire to attempt TOLAC to go into labor naturally.  There is some limited data that rupture rate may increase past [redacted] weeks gestation, but it is reasonable for women who are strongly committed to [facility] to continue pregnancy into the 41st week.  Medical indications necessetating early delivery may arise during the course of any pregnancy.  Given the contraindication on the use of prostaglandins for use in cervical ripening,  recommendation would be to proceed with repeat cesarean  for delivery for patient's with unfavorable cervix (low Bishops score) who reach 41 weeks or who otherwise have a medical indication for early delivery.    These risks  and benefits are summarized on the consent form, which was reviewed with the patient during the visit.  She took the consent form home with her to review.  Is currently undecided.  If she still desires to [facility], she will also need an MFM consult.  If patient desires repeat C-section, she can be scheduled as desired, at approximately 39 weeks. Will need pre-op appointment at 64 weeks with MD. RTC in 2 weeks for next OB visit with midwives.    A total of 40 minutes were spent face-to-face with the patient during this encounter and over half of that time involved counseling and coordination of care.  Rubie Maid, MD [facility]

## 2016-11-10 NOTE — Progress Notes (Signed)
ROB- discuss vbac- declines flu vaccine at this time.

## 2016-11-25 ENCOUNTER — Encounter: Payer: BC Managed Care – PPO | Admitting: Obstetrics and Gynecology

## 2016-11-27 ENCOUNTER — Encounter: Payer: Self-pay | Admitting: Certified Nurse Midwife

## 2016-11-27 ENCOUNTER — Ambulatory Visit (INDEPENDENT_AMBULATORY_CARE_PROVIDER_SITE_OTHER): Payer: BC Managed Care – PPO | Admitting: Certified Nurse Midwife

## 2016-11-27 VITALS — BP 127/78 | HR 85 | Wt 197.6 lb

## 2016-11-27 DIAGNOSIS — Z23 Encounter for immunization: Secondary | ICD-10-CM

## 2016-11-27 DIAGNOSIS — Z3493 Encounter for supervision of normal pregnancy, unspecified, third trimester: Secondary | ICD-10-CM

## 2016-11-27 LAB — POCT URINALYSIS DIPSTICK
BILIRUBIN UA: NEGATIVE
GLUCOSE UA: NEGATIVE
Ketones, UA: NEGATIVE
Leukocytes, UA: NEGATIVE
Nitrite, UA: NEGATIVE
Protein, UA: NEGATIVE
RBC UA: NEGATIVE
SPEC GRAV UA: 1.015 (ref 1.010–1.025)
UROBILINOGEN UA: 0.2 U/dL
pH, UA: 7 (ref 5.0–8.0)

## 2016-11-27 MED ORDER — RANITIDINE HCL 150 MG PO TABS: 150.0000 mg | ORAL_TABLET | Freq: Two times a day (BID) | ORAL | 2 refills | Status: DC

## 2016-11-27 NOTE — Patient Instructions (Signed)
Trial of Labor After Cesarean Delivery A trial of labor after cesarean delivery (TOLAC) is when a woman tries to give birth vaginally after a previous cesarean delivery. TOLAC may be a safe and appropriate option for you depending on your medical history and other risk factors. When TOLAC is successful and you are able to have a vaginal delivery, this is called a vaginal birth after cesarean delivery (VBAC). Candidates for TOLAC TOLAC is possible for some women who:  Have undergone one or two prior cesarean deliveries in which the incision of the uterus was horizontal (low transverse).  Are carrying twins and have had one prior low transverse incision during a cesarean delivery.  Do not have a vertical (classical) uterine scar.  Have not had a tear in the wall of their uterus (uterine rupture).  TOLAC is also supported for women who meet appropriate criteria and:  Are under the age of 65 years.  Are tall and have a body mass index (BMI) of less than 30.  Have an unknown uterine scar.  Give birth in a facility equipped to handle an emergency cesarean delivery. This team should be able to handle possible complications such as a uterine rupture.  Have thorough counseling about the benefits and risks of TOLAC.  Have discussed future pregnancy plans with their health care provider.  Plan to have several more pregnancies.  Most successful candidates for TOLAC:  Have had a successful vaginal delivery before or after their cesarean delivery.  Experience labor that begins naturally on or before the due date (40 weeks of gestation).  Do not have a very large (macrosomic) baby.  Had a prior cesarean delivery but are not currently experiencing factors that would prompt a cesarean delivery (such as a breech position).  Had only one prior cesarean delivery.  Had a prior cesarean delivery that was performed early in labor and not after full cervical dilation. TOLAC may be most appropriate  for women who meet the above guidelines and who plan to have more pregnancies. TOLAC is not recommended for home births. Least successful candidates for TOLAC:  Have an induced labor with an unfavorable cervix. An unfavorable cervix is when the cervix is not dilating enough (among other factors).  Have never had a vaginal delivery.  Have had more than two cesarean deliveries.  Have a pregnancy at more than 40 weeks of gestation.  Are pregnant with a baby with a suspected weight greater than 4,000 grams (8 pounds) and who have no prior history of a vaginal delivery.  Have closely spaced pregnancies. Suggested benefits of TOLAC  You may have a faster recovery time.  You may have a shorter stay in the hospital.  You may have less pain and fewer problems than with a cesarean delivery. Women who have a cesarean delivery have a higher chance of needing blood or getting a fever, an infection, or a blood clot in the legs. Suggested risks of TOLAC The highest risk of complications happens to women who attempt a TOLAC and fail. A failed TOLAC results in an unplanned cesarean delivery. Risks related to [facility] or repeat cesarean deliveries include:  Blood loss.  Infection.  Blood clot.  Injury to surrounding tissues or organs.  Having to remove the uterus (hysterectomy).  Potential problems with the placenta (such as placenta previa or placenta accreta) in future pregnancies.  Although very rare, the main concerns with TOLAC are:  Rupture of the uterine scar from a past cesarean delivery.  Needing an emergency  cesarean delivery.  Having a bad outcome for the baby (perinatal morbidity).  Where to find more information:  American Congress of Obstetricians and Gynecologists: www.acog.org  American College of Nurse-Midwives: www.midwife.org This information is not intended to replace advice given to you by your health care provider. Make sure you discuss any questions you have with  your health care provider. Document Released: 10/14/2010 Document Revised: 12/25/2015 Document Reviewed: 07/18/2012 Elsevier Interactive Patient Education  2018 Elsevier Inc.  

## 2016-11-27 NOTE — Progress Notes (Signed)
ROB- Pt does she did have some pelvic pressure over the weekend, pt states she has no complaints, flu-would like to discuss if she is ok to have flu vaccine

## 2016-11-27 NOTE — Progress Notes (Signed)
ROB-Pt returning to midwifery service after consultation with Dr. Marcelline Mates to discuss TOLAC vs ERCS. Report visit informative, but still undecided at this time. Offered referral to MFM to further discuss, pt declines at this time. Reports increased reflux and little relief with Tums. Rx: Zantac, see orders. Discussed home treatment measures. Encouraged use of abdominal support when working. Flu vaccine today. Anticipatory guidance regarding course of prenatal care. Reviewed red flag symptoms and when to call. RTC x 2 weeks for 36 week cultures and ROB with Deneise Lever.

## 2016-12-11 ENCOUNTER — Encounter: Payer: Self-pay | Admitting: Certified Nurse Midwife

## 2016-12-11 ENCOUNTER — Ambulatory Visit (INDEPENDENT_AMBULATORY_CARE_PROVIDER_SITE_OTHER): Payer: BC Managed Care – PPO | Admitting: Certified Nurse Midwife

## 2016-12-11 VITALS — BP 126/72 | HR 84 | Wt 198.6 lb

## 2016-12-11 DIAGNOSIS — Z202 Contact with and (suspected) exposure to infections with a predominantly sexual mode of transmission: Secondary | ICD-10-CM

## 2016-12-11 DIAGNOSIS — Z3685 Encounter for antenatal screening for Streptococcus B: Secondary | ICD-10-CM

## 2016-12-11 DIAGNOSIS — Z3493 Encounter for supervision of normal pregnancy, unspecified, third trimester: Secondary | ICD-10-CM

## 2016-12-11 LAB — POCT URINALYSIS DIPSTICK
Bilirubin, UA: NEGATIVE
Glucose, UA: NEGATIVE
Ketones, UA: NEGATIVE
Leukocytes, UA: NEGATIVE
NITRITE UA: NEGATIVE
PH UA: 7.5 (ref 5.0–8.0)
PROTEIN UA: NEGATIVE
RBC UA: NEGATIVE
UROBILINOGEN UA: 0.2 U/dL

## 2016-12-11 NOTE — Progress Notes (Signed)
ROB- no complaints-- 36 week labs today.

## 2016-12-11 NOTE — Patient Instructions (Signed)

## 2016-12-11 NOTE — Progress Notes (Signed)
ROB, doing well. No complaints . GBS and cultures today. Discussed remainder of prenatal care. She declined SVE today. Will follow up with result. Return in 1 wk.    Philip Aspen, CNM

## 2016-12-13 LAB — STREP GP B NAA: STREP GROUP B AG: POSITIVE — AB

## 2016-12-13 LAB — GC/CHLAMYDIA PROBE AMP
Chlamydia trachomatis, NAA: NEGATIVE
NEISSERIA GONORRHOEAE BY PCR: NEGATIVE

## 2016-12-18 ENCOUNTER — Encounter: Payer: BC Managed Care – PPO | Admitting: Obstetrics and Gynecology

## 2016-12-21 ENCOUNTER — Encounter: Payer: Self-pay | Admitting: Certified Nurse Midwife

## 2016-12-21 ENCOUNTER — Ambulatory Visit (INDEPENDENT_AMBULATORY_CARE_PROVIDER_SITE_OTHER): Payer: BC Managed Care – PPO | Admitting: Certified Nurse Midwife

## 2016-12-21 VITALS — BP 131/72 | HR 81 | Wt 202.1 lb

## 2016-12-21 DIAGNOSIS — Z3493 Encounter for supervision of normal pregnancy, unspecified, third trimester: Secondary | ICD-10-CM

## 2016-12-21 LAB — POCT URINALYSIS DIPSTICK
BILIRUBIN UA: NEGATIVE
GLUCOSE UA: NEGATIVE
Ketones, UA: NEGATIVE
Leukocytes, UA: NEGATIVE
Nitrite, UA: NEGATIVE
Protein, UA: NEGATIVE
RBC UA: NEGATIVE
SPEC GRAV UA: 1.01 (ref 1.010–1.025)
Urobilinogen, UA: 0.2 E.U./dL
pH, UA: 6.5 (ref 5.0–8.0)

## 2016-12-21 NOTE — Progress Notes (Signed)
Pt is here for an [place] visit with c/o lower back pain left side more than right.

## 2016-12-21 NOTE — Patient Instructions (Signed)
Trial of Labor After Cesarean Delivery A trial of labor after cesarean delivery (TOLAC) is when a woman tries to give birth vaginally after a previous cesarean delivery. TOLAC may be a safe and appropriate option for you depending on your medical history and other risk factors. When TOLAC is successful and you are able to have a vaginal delivery, this is called a vaginal birth after cesarean delivery (VBAC). Candidates for TOLAC TOLAC is possible for some women who:  Have undergone one or two prior cesarean deliveries in which the incision of the uterus was horizontal (low transverse).  Are carrying twins and have had one prior low transverse incision during a cesarean delivery.  Do not have a vertical (classical) uterine scar.  Have not had a tear in the wall of their uterus (uterine rupture).  TOLAC is also supported for women who meet appropriate criteria and:  Are under the age of 65 years.  Are tall and have a body mass index (BMI) of less than 30.  Have an unknown uterine scar.  Give birth in a facility equipped to handle an emergency cesarean delivery. This team should be able to handle possible complications such as a uterine rupture.  Have thorough counseling about the benefits and risks of TOLAC.  Have discussed future pregnancy plans with their health care provider.  Plan to have several more pregnancies.  Most successful candidates for TOLAC:  Have had a successful vaginal delivery before or after their cesarean delivery.  Experience labor that begins naturally on or before the due date (40 weeks of gestation).  Do not have a very large (macrosomic) baby.  Had a prior cesarean delivery but are not currently experiencing factors that would prompt a cesarean delivery (such as a breech position).  Had only one prior cesarean delivery.  Had a prior cesarean delivery that was performed early in labor and not after full cervical dilation. TOLAC may be most appropriate  for women who meet the above guidelines and who plan to have more pregnancies. TOLAC is not recommended for home births. Least successful candidates for TOLAC:  Have an induced labor with an unfavorable cervix. An unfavorable cervix is when the cervix is not dilating enough (among other factors).  Have never had a vaginal delivery.  Have had more than two cesarean deliveries.  Have a pregnancy at more than 40 weeks of gestation.  Are pregnant with a baby with a suspected weight greater than 4,000 grams (8 pounds) and who have no prior history of a vaginal delivery.  Have closely spaced pregnancies. Suggested benefits of TOLAC  You may have a faster recovery time.  You may have a shorter stay in the hospital.  You may have less pain and fewer problems than with a cesarean delivery. Women who have a cesarean delivery have a higher chance of needing blood or getting a fever, an infection, or a blood clot in the legs. Suggested risks of TOLAC The highest risk of complications happens to women who attempt a TOLAC and fail. A failed TOLAC results in an unplanned cesarean delivery. Risks related to [facility] or repeat cesarean deliveries include:  Blood loss.  Infection.  Blood clot.  Injury to surrounding tissues or organs.  Having to remove the uterus (hysterectomy).  Potential problems with the placenta (such as placenta previa or placenta accreta) in future pregnancies.  Although very rare, the main concerns with TOLAC are:  Rupture of the uterine scar from a past cesarean delivery.  Needing an emergency  cesarean delivery.  Having a bad outcome for the baby (perinatal morbidity).  Where to find more information:  American Congress of Obstetricians and Gynecologists: www.acog.org  American College of Nurse-Midwives: www.midwife.org This information is not intended to replace advice given to you by your health care provider. Make sure you discuss any questions you have with  your health care provider. Document Released: 10/14/2010 Document Revised: 12/25/2015 Document Reviewed: 07/18/2012 Elsevier Interactive Patient Education  2018 Elsevier Inc.  

## 2016-12-21 NOTE — Progress Notes (Signed)
ROB-Pt doing well, desires BTL. Discussed left lower back pain and home treatment measures including exercise modification in pregnancy. Still unsure regarding TOLAC, would like to schedule repeat c-section for due date. Declines SVE. Reviewed red flag symptoms and when to call. RTC x 1 week for ROB/Pre-op appointment with Dr. Enzo Bi.

## 2016-12-29 ENCOUNTER — Observation Stay
Admission: EM | Admit: 2016-12-29 | Discharge: 2016-12-30 | Disposition: A | Discharge status: Home / Self Care | Payer: BC Managed Care – PPO | Source: Home / Self Care | Admitting: Obstetrics and Gynecology

## 2016-12-29 DIAGNOSIS — O4440 Low lying placenta NOS or without hemorrhage, unspecified trimester: Secondary | ICD-10-CM

## 2016-12-29 NOTE — OB Triage Note (Signed)
Patient reports loosing mucous plug around 2230. Contractions since this morning, however very occasional. Since 2130 contractions are every 25 mins apart per patient. Denies vaginal bleeding, reports positive FM.

## 2016-12-30 ENCOUNTER — Other Ambulatory Visit: Payer: Self-pay

## 2016-12-30 ENCOUNTER — Inpatient Hospital Stay
Admission: EM | Admit: 2016-12-30 | Discharge: 2017-01-02 | DRG: 785 | Disposition: A | Discharge status: Home / Self Care | Payer: BC Managed Care – PPO | Attending: Obstetrics and Gynecology | Admitting: Obstetrics and Gynecology

## 2016-12-30 ENCOUNTER — Encounter: Payer: Self-pay | Admitting: *Deleted

## 2016-12-30 ENCOUNTER — Encounter
Admission: EM | Disposition: A | Discharge status: Home / Self Care | Payer: Self-pay | Source: Home / Self Care | Attending: Obstetrics and Gynecology

## 2016-12-30 ENCOUNTER — Inpatient Hospital Stay: Payer: BC Managed Care – PPO | Admitting: Certified Registered Nurse Anesthetist

## 2016-12-30 ENCOUNTER — Encounter: Payer: BC Managed Care – PPO | Admitting: Obstetrics and Gynecology

## 2016-12-30 DIAGNOSIS — Z3A39 39 weeks gestation of pregnancy: Secondary | ICD-10-CM

## 2016-12-30 DIAGNOSIS — Z302 Encounter for sterilization: Secondary | ICD-10-CM | POA: Diagnosis not present

## 2016-12-30 DIAGNOSIS — O34211 Maternal care for low transverse scar from previous cesarean delivery: Secondary | ICD-10-CM | POA: Diagnosis present

## 2016-12-30 DIAGNOSIS — O99824 Streptococcus B carrier state complicating childbirth: Secondary | ICD-10-CM | POA: Diagnosis present

## 2016-12-30 DIAGNOSIS — O4440 Low lying placenta NOS or without hemorrhage, unspecified trimester: Secondary | ICD-10-CM

## 2016-12-30 DIAGNOSIS — Z98891 History of uterine scar from previous surgery: Secondary | ICD-10-CM

## 2016-12-30 HISTORY — DX: Other specified health status: Z78.9

## 2016-12-30 LAB — CBC WITH DIFFERENTIAL/PLATELET
BASOS PCT: 1 %
Basophils Absolute: 0 10*3/uL (ref 0–0.1)
EOS ABS: 0 10*3/uL (ref 0–0.7)
EOS PCT: 0 %
HCT: 37 % (ref 35.0–47.0)
HEMOGLOBIN: 12.2 g/dL (ref 12.0–16.0)
LYMPHS ABS: 0.8 10*3/uL — AB (ref 1.0–3.6)
Lymphocytes Relative: 13 %
MCH: 28.3 pg (ref 26.0–34.0)
MCHC: 32.9 g/dL (ref 32.0–36.0)
MCV: 85.8 fL (ref 80.0–100.0)
Monocytes Absolute: 0.3 10*3/uL (ref 0.2–0.9)
Monocytes Relative: 5 %
NEUTROS PCT: 81 %
Neutro Abs: 5.4 10*3/uL (ref 1.4–6.5)
Platelets: 189 10*3/uL (ref 150–440)
RBC: 4.31 MIL/uL (ref 3.80–5.20)
RDW: 13.8 % (ref 11.5–14.5)
WBC: 6.6 10*3/uL (ref 3.6–11.0)

## 2016-12-30 LAB — TYPE AND SCREEN
ABO/RH(D): AB POS
ANTIBODY SCREEN: NEGATIVE

## 2016-12-30 LAB — RAPID HIV SCREEN (HIV 1/2 AB+AG)
HIV 1/2 Antibodies: NONREACTIVE
HIV-1 P24 ANTIGEN - HIV24: NONREACTIVE

## 2016-12-30 SURGERY — Surgical Case
Anesthesia: Epidural

## 2016-12-30 MED ORDER — LACTATED RINGERS IV SOLN
INTRAVENOUS | Status: DC
  Administered 2016-12-30: 13:00:00 via INTRAVENOUS

## 2016-12-30 MED ORDER — FENTANYL CITRATE (PF) 100 MCG/2ML IJ SOLN: 25.0000 ug | INTRAMUSCULAR | Status: DC | PRN

## 2016-12-30 MED ORDER — MORPHINE SULFATE (PF) 0.5 MG/ML IJ SOLN
INTRAMUSCULAR | Status: DC | PRN
  Administered 2016-12-30: .1 mg via EPIDURAL

## 2016-12-30 MED ORDER — LIDOCAINE 5 % EX PTCH
MEDICATED_PATCH | CUTANEOUS | Status: AC
  Filled 2016-12-30: qty 1

## 2016-12-30 MED ORDER — NALBUPHINE HCL 10 MG/ML IJ SOLN: 5.0000 mg | Freq: Once | INTRAMUSCULAR | Status: DC | PRN

## 2016-12-30 MED ORDER — NALBUPHINE HCL 10 MG/ML IJ SOLN: 5.0000 mg | INTRAMUSCULAR | Status: DC | PRN

## 2016-12-30 MED ORDER — ONDANSETRON HCL 4 MG/2ML IJ SOLN
INTRAMUSCULAR | Status: DC | PRN
  Administered 2016-12-30: 4 mg via INTRAVENOUS

## 2016-12-30 MED ORDER — KETOROLAC TROMETHAMINE 30 MG/ML IJ SOLN: 30.0000 mg | Freq: Four times a day (QID) | INTRAMUSCULAR | Status: DC | PRN

## 2016-12-30 MED ORDER — SOD CITRATE-CITRIC ACID 500-334 MG/5ML PO SOLN
30.0000 mL | ORAL | Status: AC
  Administered 2016-12-30: 30 mL via ORAL
  Filled 2016-12-30 (×2): qty 15

## 2016-12-30 MED ORDER — IBUPROFEN 600 MG PO TABS
600.0000 mg | ORAL_TABLET | Freq: Four times a day (QID) | ORAL | Status: DC
  Administered 2016-12-30 – 2017-01-02 (×12): 600 mg via ORAL
  Filled 2016-12-30 (×13): qty 1

## 2016-12-30 MED ORDER — EPHEDRINE SULFATE 50 MG/ML IJ SOLN
INTRAMUSCULAR | Status: DC | PRN
  Administered 2016-12-30 (×2): 10 mg via INTRAVENOUS

## 2016-12-30 MED ORDER — DEXTROSE 5 % IV SOLN
2.0000 g | INTRAVENOUS | Status: AC
  Administered 2016-12-30: 2 g via INTRAVENOUS
  Filled 2016-12-30: qty 20

## 2016-12-30 MED ORDER — DIPHENHYDRAMINE HCL 50 MG/ML IJ SOLN: 12.5000 mg | INTRAMUSCULAR | Status: DC | PRN

## 2016-12-30 MED ORDER — OXYTOCIN 40 UNITS IN LACTATED RINGERS INFUSION - SIMPLE MED
2.5000 [IU]/h | INTRAVENOUS | Status: DC
  Administered 2016-12-30: 1000 mL via INTRAVENOUS
  Filled 2016-12-30: qty 1000

## 2016-12-30 MED ORDER — OXYCODONE-ACETAMINOPHEN 5-325 MG PO TABS
1.0000 | ORAL_TABLET | ORAL | Status: DC | PRN
  Administered 2016-12-31 – 2017-01-02 (×6): 1 via ORAL
  Filled 2016-12-30 (×7): qty 1

## 2016-12-30 MED ORDER — PRENATAL MULTIVITAMIN CH
1.0000 | ORAL_TABLET | Freq: Every day | ORAL | Status: DC
  Administered 2016-12-31 – 2017-01-02 (×3): 1 via ORAL
  Filled 2016-12-30 (×3): qty 1

## 2016-12-30 MED ORDER — ACETAMINOPHEN 325 MG PO TABS
650.0000 mg | ORAL_TABLET | ORAL | Status: DC | PRN
Start: 2016-12-30 — End: 2016-12-30

## 2016-12-30 MED ORDER — CEFAZOLIN SODIUM 1 G IJ SOLR
2.0000 g | INTRAMUSCULAR | Status: AC
  Administered 2016-12-30: 2 g via INTRAVENOUS
  Filled 2016-12-30: qty 20

## 2016-12-30 MED ORDER — ZOLPIDEM TARTRATE 5 MG PO TABS: 5.0000 mg | ORAL_TABLET | Freq: Every evening | ORAL | Status: DC | PRN

## 2016-12-30 MED ORDER — MEPERIDINE HCL 25 MG/ML IJ SOLN: 6.2500 mg | INTRAMUSCULAR | Status: DC | PRN

## 2016-12-30 MED ORDER — SENNOSIDES-DOCUSATE SODIUM 8.6-50 MG PO TABS
2.0000 | ORAL_TABLET | ORAL | Status: DC
  Administered 2016-12-30 – 2017-01-02 (×3): 2 via ORAL
  Filled 2016-12-30 (×4): qty 2

## 2016-12-30 MED ORDER — SIMETHICONE 80 MG PO CHEW
80.0000 mg | CHEWABLE_TABLET | Freq: Four times a day (QID) | ORAL | Status: DC
  Administered 2016-12-30 – 2017-01-02 (×10): 80 mg via ORAL
  Filled 2016-12-30 (×10): qty 1

## 2016-12-30 MED ORDER — ONDANSETRON HCL 4 MG/2ML IJ SOLN: 4.0000 mg | Freq: Four times a day (QID) | INTRAMUSCULAR | Status: DC | PRN

## 2016-12-30 MED ORDER — OXYCODONE HCL 5 MG PO TABS: 5.0000 mg | ORAL_TABLET | Freq: Four times a day (QID) | ORAL | Status: DC | PRN

## 2016-12-30 MED ORDER — OXYTOCIN BOLUS FROM INFUSION
500.0000 mL | Freq: Once | INTRAVENOUS | Status: DC
  Administered 2016-12-30: 500 mL via INTRAVENOUS

## 2016-12-30 MED ORDER — PHENYLEPHRINE HCL 10 MG/ML IJ SOLN
INTRAMUSCULAR | Status: DC | PRN
  Administered 2016-12-30 (×3): 100 ug via INTRAVENOUS

## 2016-12-30 MED ORDER — ONDANSETRON HCL 4 MG/2ML IJ SOLN: 4.0000 mg | Freq: Three times a day (TID) | INTRAMUSCULAR | Status: DC | PRN

## 2016-12-30 MED ORDER — OXYTOCIN 40 UNITS IN LACTATED RINGERS INFUSION - SIMPLE MED
2.5000 [IU]/h | INTRAVENOUS | Status: DC
  Administered 2016-12-30: 2.5 [IU]/h via INTRAVENOUS
  Filled 2016-12-30: qty 1000

## 2016-12-30 MED ORDER — LIDOCAINE 5 % EX PTCH
MEDICATED_PATCH | CUTANEOUS | Status: DC | PRN
  Administered 2016-12-30: 1 via TRANSDERMAL

## 2016-12-30 MED ORDER — NALOXONE HCL 0.4 MG/ML IJ SOLN
1.0000 ug/kg/h | INTRAVENOUS | Status: DC | PRN
  Filled 2016-12-30: qty 5

## 2016-12-30 MED ORDER — DIPHENHYDRAMINE HCL 25 MG PO CAPS: 25.0000 mg | ORAL_CAPSULE | Freq: Four times a day (QID) | ORAL | Status: DC | PRN

## 2016-12-30 MED ORDER — LACTATED RINGERS IV SOLN
500.0000 mL | INTRAVENOUS | Status: DC | PRN
  Administered 2016-12-30: 1000 mL via INTRAVENOUS

## 2016-12-30 MED ORDER — SODIUM CHLORIDE 0.9% FLUSH: 3.0000 mL | INTRAVENOUS | Status: DC | PRN

## 2016-12-30 MED ORDER — LACTATED RINGERS IV SOLN: INTRAVENOUS | Status: DC

## 2016-12-30 MED ORDER — ACETAMINOPHEN 325 MG PO TABS: 650.0000 mg | ORAL_TABLET | ORAL | Status: DC | PRN

## 2016-12-30 MED ORDER — SOD CITRATE-CITRIC ACID 500-334 MG/5ML PO SOLN: 30.0000 mL | ORAL | Status: DC | PRN

## 2016-12-30 MED ORDER — NALOXONE HCL 0.4 MG/ML IJ SOLN: 0.4000 mg | INTRAMUSCULAR | Status: DC | PRN

## 2016-12-30 MED ORDER — DIPHENHYDRAMINE HCL 25 MG PO CAPS: 25.0000 mg | ORAL_CAPSULE | ORAL | Status: DC | PRN

## 2016-12-30 MED ORDER — ONDANSETRON HCL 4 MG/2ML IJ SOLN
4.0000 mg | Freq: Once | INTRAMUSCULAR | Status: DC | PRN
Start: 2016-12-30 — End: 2016-12-30

## 2016-12-30 MED ORDER — OXYCODONE-ACETAMINOPHEN 5-325 MG PO TABS
2.0000 | ORAL_TABLET | ORAL | Status: DC | PRN
  Administered 2016-12-31 – 2017-01-02 (×5): 2 via ORAL
  Filled 2016-12-30 (×5): qty 2

## 2016-12-30 MED ORDER — MENTHOL 3 MG MT LOZG
1.0000 | LOZENGE | OROMUCOSAL | Status: DC | PRN
  Filled 2016-12-30: qty 9

## 2016-12-30 MED ORDER — FENTANYL CITRATE (PF) 100 MCG/2ML IJ SOLN
INTRAMUSCULAR | Status: DC | PRN
  Administered 2016-12-30: 10 ug via INTRAVENOUS

## 2016-12-30 MED ORDER — BUPIVACAINE IN DEXTROSE 0.75-8.25 % IT SOLN
INTRATHECAL | Status: DC | PRN
  Administered 2016-12-30: 1.8 mL via INTRATHECAL

## 2016-12-30 SURGICAL SUPPLY — 26 items
ADHESIVE MASTISOL STRL (MISCELLANEOUS) ×3 IMPLANT
BAG COUNTER SPONGE EZ (MISCELLANEOUS) ×2 IMPLANT
CANISTER SUCT 3000ML PPV (MISCELLANEOUS) ×3 IMPLANT
CELL SAVER LIPIGURD (MISCELLANEOUS) ×1 IMPLANT
CHLORAPREP W/TINT 26ML (MISCELLANEOUS) ×6 IMPLANT
CLIP FILSHIE TUBAL LIGA STRL (Clip) ×3 IMPLANT
COUNTER SPONGE BAG EZ (MISCELLANEOUS) ×1
DRSG TELFA 3X8 NADH (GAUZE/BANDAGES/DRESSINGS) ×3 IMPLANT
EXTRT SYSTEM ALEXIS 14CM (MISCELLANEOUS) ×3
GAUZE SPONGE 4X4 12PLY STRL (GAUZE/BANDAGES/DRESSINGS) ×3 IMPLANT
GLOVE INDICATOR 7.0 STRL GRN (GLOVE) ×3 IMPLANT
GLOVE ORTHO TXT STRL SZ7.5 (GLOVE) ×3 IMPLANT
GLOVE PROTEXIS LATEX SZ 7.5 (GLOVE) ×3 IMPLANT
GOWN STRL REUS W/ TWL LRG LVL3 (GOWN DISPOSABLE) ×2 IMPLANT
GOWN STRL REUS W/TWL LRG LVL3 (GOWN DISPOSABLE) ×4
KIT RM TURNOVER STRD PROC AR (KITS) ×3 IMPLANT
NS IRRIG 1000ML POUR BTL (IV SOLUTION) ×3 IMPLANT
PACK C SECTION AR (MISCELLANEOUS) ×3 IMPLANT
PAD OB MATERNITY 4.3X12.25 (PERSONAL CARE ITEMS) ×3 IMPLANT
PAD PREP 24X41 OB/GYN DISP (PERSONAL CARE ITEMS) ×3 IMPLANT
RTRCTR C-SECT PINK 25CM LRG (MISCELLANEOUS) ×3 IMPLANT
SPONGE LAP 18X18 5 PK (GAUZE/BANDAGES/DRESSINGS) ×3 IMPLANT
SUT VIC AB 0 CTX 36 (SUTURE) ×4
SUT VIC AB 0 CTX36XBRD ANBCTRL (SUTURE) ×2 IMPLANT
SUT VIC AB 1 CT1 36 (SUTURE) ×6 IMPLANT
SUT VICRYL+ 3-0 36IN CT-1 (SUTURE) ×6 IMPLANT

## 2016-12-30 NOTE — Transfer of Care (Signed)
Immediate Anesthesia Transfer of Care Note  Patient: Kim Wood Platt  Procedure(s) Performed: CESAREAN SECTION WITH BILATERAL TUBAL LIGATION (N/A )  Patient Location: L/D  Anesthesia Type:Spinal  Level of Consciousness: awake  Airway & Oxygen Therapy: Patient Spontanous Breathing  Post-op Assessment: Report given to RN  Post vital signs: Reviewed  Last Vitals:  Vitals:   12/30/16 1029  BP: 130/74  Pulse: 71  Resp: 18  Temp: 36.4 C    Last Pain:  Vitals:   12/30/16 1029  TempSrc: Oral  PainSc: 8          Complications: No apparent anesthesia complications

## 2016-12-30 NOTE — OB Triage Note (Signed)
Was seen @ hospital last night. Reports being 1 cm dilated then. Currently 1-2 cm dilated. Reports contracting all night. 1100 appointment today with Dr. Enzo Bi to discuss scheduling a C- section. Dineen Kid

## 2016-12-30 NOTE — Op Note (Signed)
      OP NOTE  Date: 12/30/2016   2:57 PM Name Kim Wood MR# [id number]  Preoperative Diagnosis: 1. Intrauterine pregnancy at [redacted]w[redacted]d 2. Desires Permanent Sterilization Active Problems:   History of C-section 3.  Pt with painful contractions  Postoperative Diagnosis: 1. Intrauterine pregnancy at [redacted]w[redacted]d, delivered 2. Desires Permanent Sterilization 3. Viable infant 4. Remainder same as pre-op  Procedure: 1. Repeat Low-Transverse Cesarean Section 2. Bilateral Tubal Occlusion  Surgeon: Finis Bud, MD  Assistant:  Grandville Silos, A  CNM  Anesthesia: Spinal   EBL: 750 ml    Findings: 1) female infant, Apgar scores of 9    at 1 minute and 9    at 5 minutes and a birthweight of 113.93  ounces.    2) Normal uterus, tubes and ovaries.   Procedure:   The patient was prepped and draped in the supine position and placed under spinal anesthesia.  A transverse incision was made across the abdomen in a Pfannenstiel manner. If indicated the old scar was systematically removed with sharp dissection.  We carried the dissection down to the level of the fascia.  The fascia was incised in a curvilinear manner.  The fascia was then elevated from the rectus muscles with blunt and sharp dissection.  The rectus muscles were separated laterally exposing the peritoneum.  The peritoneum was carefully entered with care being taken to avoid bowel and bladder.  A self-retaining retractor was placed.  The visceral peritoneum was incised in a curvilinear fashion across the lower uterine segment creating a bladder flap. A transverse incision was made across the lower uterine segment and extended laterally and superiorly using the bandage scissors.  Artificial rupture membranes was performed and Clear fluid was noted.  The infant was delivered from the cephalic position.  A nuchal cord was not present. The cord was doubly clamped and cut. Cord blood was obtained if appropriate.  The infant was handed to  the pediatric personnel  who then placed the infant under heat lamps where it was cleaned dried and re-suctioned. The placenta was delivered. The hysterotomy incision was then identified on ring forceps.  The uterine cavity was cleaned with a moist lap sponge.  The hysterotomy incision was closed with a running interlocking suture of Vicryl.  Hemostasis was excellent.  Pitocin was run in the IV and the uterus was found to be firm. The fallopian tubes were identified  and followed out to their fine fimbriated ends and then back to the mid-portion of the tubeon which was elevated on a babcock clamp.  Both tubes were completely occluded using Filshie clips in a perpendicular manner.  Hemostasis was noted. The posterior cul-de-sac and gutters were cleaned and inspected.  Hemostasis was noted.  The fascia was then closed with a running suture of #1 Vicryl.  Hemostasis of the subcutaneous tissues was obtained using the Bovie.  The subcutaneous tissues were closed with a running suture of 000 Vicryl.  A subcuticular suture was placed.  Steri-Strips were applied in the usual manner.  A pressure dressing was placed.  The patient went to the recovery room in stable condition.   Finis Bud, M.D. 12/30/2016 2:57 PM

## 2016-12-30 NOTE — H&P (Signed)
    History and Physical   HPI  Kim Wood is a 41 y.o. [id number] at [redacted]w[redacted]d Estimated Date of Delivery: 01/06/17 who is being admitted for  C-section with tubal sterilization.  Was considering TOLAC but now desires repeat CD.   OB History  Obstetric History   G4   P2   T2   P0   A1   L2    SAB0   TAB0   Ectopic1   Multiple0   Live Births2     # Outcome Date GA Lbr Len/2nd Weight Sex Delivery Anes PTL Lv  4 Current           3 Ectopic 2018          2 Term 2014 [redacted]w[redacted]d  6 lb 11 oz (3.033 kg) M CS-Unspec Spinal Y LIV  1 Term 2012 [redacted]w[redacted]d  6 lb 9 oz (2.977 kg) M CS-Unspec Spinal N LIV     Complications: Placenta Previa      PROBLEM LIST  Pregnancy complications or risks: Patient Active Problem List   Diagnosis Date Noted  . Labor and delivery, indication for care 12/30/2016  . Low-lying placenta 09/17/2016  . Previous cesarean section 06/26/2016  . Pregnancy 06/05/2016  . Advanced maternal age in multigravida 06/05/2016    Prenatal labs and studies: ABO, Rh: AB/Positive/-- 06/28/22 1151) Antibody: Negative 06/28/22 1151) Rubella: 1.39 06/28/2022 1151) RPR: Non Reactive 2022-06-28 1151)  HBsAg: Negative 28-Jun-2022 1151)  HIV:    AVW:[id number] (11/02 1355)   Past Medical History:  Diagnosis Date  . Medical history non-contributory      Past Surgical History:  Procedure Laterality Date  . CESAREAN SECTION     x2     Medications      Medication List    ASK your doctor about these medications   multivitamin-prenatal 27-0.8 MG Tabs tablet   ranitidine 150 MG tablet Commonly known as:  ZANTAC Take 1 tablet (150 mg total) by mouth 2 (two) times daily.        Allergies  Patient has no known allergies.  Review of Systems  Pertinent items are noted in HPI.  Physical Exam  BP 130/74 (BP Location: Right Arm)   Pulse 71   Temp 97.6 F (36.4 C) (Oral)   Resp 18   Ht 5' 6.5" (1.689 m)   Wt 201 lb (91.2 kg)   LMP 03/21/2016 (Approximate)   BMI 31.96 kg/m     Lungs:  CTA B Cardio: RRR without M/R/G Abd: Soft, gravid, NT Presentation: cephalic EXT: No C/C/ 1+ Edema DTRs: 2+ B CERVIX: 2 cm  :    See Prenatal records for more detailed PE.   FHR:  Variability: Good {> 6 bpm)  Toco: Uterine Contractions: q3-6 min   Test Results  No results found for this or any previous visit (from the past 24 hour(s)).   Assessment   [id number] at [redacted]w[redacted]d Estimated Date of Delivery: 01/06/17  The fetus is reassuring.   Patient Active Problem List   Diagnosis Date Noted  . Labor and delivery, indication for care 12/30/2016  . Low-lying placenta 09/17/2016  . Previous cesarean section 06/26/2016  . Pregnancy 06/05/2016  . Advanced maternal age in multigravida 06/05/2016    Plan  1. Admit to L&D :   plan Cesarean delivery 2. EFM: -- Category 1 3. Anticipate Spinal Anes 4. Admission labs    Finis Bud, M.D. 12/30/2016 11:03 AM

## 2016-12-30 NOTE — Anesthesia Procedure Notes (Addendum)
Spinal  Patient location during procedure: OR Start time: 12/30/2016 1:29 PM End time: 12/30/2016 1:42 PM Staffing Anesthesiologist: Martha Clan, MD Resident/CRNA: Timoteo Expose, CRNA Performed: resident/CRNA and anesthesiologist  Preanesthetic Checklist Completed: patient identified, site marked, surgical consent, pre-op evaluation, timeout performed, IV checked, risks and benefits discussed and monitors and equipment checked Spinal Block Patient position: sitting Prep: Betadine Patient monitoring: heart rate, continuous pulse ox and blood pressure Approach: midline Location: L3-4 Injection technique: single-shot Needle Needle type: Introducer and Whitacre  Needle gauge: 25 G Needle length: 12.7 cm Assessment Sensory level: T10 Additional Notes Patient identified preoperatively, IV verified, monitors applied.  Clear CSF flow with slow injection of medications.  Procedure completed without complication and patient remains HDS.

## 2016-12-30 NOTE — Anesthesia Preprocedure Evaluation (Signed)
Anesthesia Evaluation  Patient identified by MRN, date of birth, ID band Patient awake    Reviewed: Allergy & Precautions, H&P , NPO status , Patient's Chart, lab work & pertinent test results, reviewed documented beta blocker date and time   History of Anesthesia Complications Negative for: history of anesthetic complications  Airway Mallampati: II  TM Distance: >3 FB Neck ROM: full    Dental  (+) Dental Advidsory Given, Teeth Intact   Pulmonary neg pulmonary ROS,           Cardiovascular Exercise Tolerance: Good negative cardio ROS       Neuro/Psych negative neurological ROS  negative psych ROS   GI/Hepatic Neg liver ROS, GERD  ,  Endo/Other  negative endocrine ROS  Renal/GU negative Renal ROS  negative genitourinary   Musculoskeletal   Abdominal   Peds  Hematology negative hematology ROS (+)   Anesthesia Other Findings Past Medical History: No date: Medical history non-contributory   Reproductive/Obstetrics (+) Pregnancy                             Anesthesia Physical Anesthesia Plan  ASA: II  Anesthesia Plan: Epidural   Post-op Pain Management:    Induction:   PONV Risk Score and Plan: 2 and Ondansetron and Dexamethasone  Airway Management Planned: Nasal Cannula  Additional Equipment:   Intra-op Plan:   Post-operative Plan:   Informed Consent: I have reviewed the patients History and Physical, chart, labs and discussed the procedure including the risks, benefits and alternatives for the proposed anesthesia with the patient or authorized representative who has indicated his/her understanding and acceptance.   Dental Advisory Given  Plan Discussed with: Anesthesiologist, CRNA and Surgeon  Anesthesia Plan Comments:         Anesthesia Quick Evaluation

## 2016-12-30 NOTE — Interval H&P Note (Signed)
History and Physical Interval Note:  12/30/2016 11:06 AM  Kim Wood  has presented today for surgery, with the diagnosis of n/a  The various methods of treatment have been discussed with the patient and family. After consideration of risks, benefits and other options for treatment, the patient has consented to  Procedure(s): [name] (N/A) as a surgical intervention .  The patient's history has been reviewed, patient examined, no change in status, stable for surgery.  I have reviewed the patient's chart and labs.  Questions were answered to the patient's satisfaction.     Jeannie Fend

## 2016-12-30 NOTE — Anesthesia Post-op Follow-up Note (Signed)
Anesthesia QCDR form completed.        

## 2016-12-30 NOTE — OB Triage Note (Signed)
@  DEYCXKG@  L&D OB Triage Note  SUBJECTIVE Kim Wood is a 41 y.o. [id number] female at [redacted]w[redacted]d, EDD Estimated Date of Delivery: 01/06/17 who presented to triage with complaints of contraction a few time an hour. Endorses good fetal movement. Denies LOF or vaginal bleeding.   Obstetric History   G4   P3   T3   P0   A1   L3    SAB0   TAB0   Ectopic1   Multiple0   Live Births3     # Outcome Date GA Lbr Len/2nd Weight Sex Delivery Anes PTL Lv  4 Term 12/30/16 [redacted]w[redacted]d  7 lb 1.9 oz (3.23 kg) F CS-LTranv Spinal  LIV     Name: FOUST PLATT,PENDINGBABY     Apgar1:  9                Apgar5: 9  3 Ectopic 2018          2 Term 2014 [redacted]w[redacted]d  6 lb 11 oz (3.033 kg) M CS-Unspec Spinal Y LIV  1 Term 2012 [redacted]w[redacted]d  6 lb 9 oz (2.977 kg) M CS-Unspec Spinal N LIV     Complications: Placenta Previa      Medications Prior to Admission  Medication Sig Dispense Refill Last Dose  . Prenatal Vit-Fe Fumarate-FA (MULTIVITAMIN-PRENATAL) 27-0.8 MG TABS tablet Take 1 tablet by mouth daily at 12 noon.   12/29/2016 at Unknown time  . ranitidine (ZANTAC) 150 MG tablet Take 1 tablet (150 mg total) by mouth 2 (two) times daily. 60 tablet 2 12/29/2016 at Unknown time     OBJECTIVE  Nursing Evaluation:   BP 124/75 (BP Location: Left Arm)   Pulse 82   Temp 97.9 F (36.6 C) (Oral)   Resp 18   LMP 03/21/2016 (Approximate)    Findings:  Reactive   NST was performed and has been reviewed by me.  NST INTERPRETATION: Category I  Mode: External Baseline Rate (A): 130 bpm Variability: Moderate Accelerations: 15 x 15 Decelerations: None     Contraction Frequency (min): rare (x2)  ASSESSMENT Impression:  1.  Pregnancy:  [id number] at [redacted]w[redacted]d , EDD Estimated Date of Delivery: 01/06/17 2.  NST:  reactive  PLAN 1. Reassurance given 2. Discharge home with standard labor precautions given to return to L&D or call the office for problems.  3. Continue routine prenatal care.

## 2016-12-31 LAB — RPR: RPR Ser Ql: NONREACTIVE

## 2016-12-31 MED ORDER — COCONUT OIL OIL
1.0000 "application " | TOPICAL_OIL | Status: DC | PRN
  Administered 2016-12-31: 1 via TOPICAL
  Filled 2016-12-31: qty 120

## 2016-12-31 NOTE — Anesthesia Postprocedure Evaluation (Signed)
Anesthesia Post Note  Patient: Kim Wood  Procedure(s) Performed: CESAREAN SECTION WITH BILATERAL TUBAL LIGATION (N/A )  Patient location during evaluation: PACU Anesthesia Type: Epidural Level of consciousness: oriented and awake and alert Pain management: pain level controlled Vital Signs Assessment: post-procedure vital signs reviewed and stable Respiratory status: spontaneous breathing, respiratory function stable and patient connected to nasal cannula oxygen Cardiovascular status: blood pressure returned to baseline and stable Postop Assessment: no headache, no backache and no apparent nausea or vomiting Anesthetic complications: no     Last Vitals:  Vitals:   12/31/16 0627 12/31/16 0724  BP: 106/67 110/65  Pulse: 75 69  Resp: 18 18  Temp: 36.4 C 36.5 C  SpO2: 98% 98%    Last Pain:  Vitals:   12/31/16 0730  TempSrc:   PainSc: [place]

## 2016-12-31 NOTE — Progress Notes (Signed)
Patient ID: Kim Wood, female   DOB: January 19, 1976, 41 y.o.   MRN: [id number]    Progress Note - Cesarean Delivery  Kim Wood is a 41 y.o. [phone] now PP day 1 s/p C-Section, Low Transverse .   Subjective:  Patient reports no problems with eating, bowel movements, voiding, or their wound  Objective:  Vital signs in last 24 hours: Temp:  [96.9 F (36.1 C)-97.9 F (36.6 C)] 97.7 F (36.5 C) (11/22 0724) Pulse Rate:  [69-123] 69 (11/22 0724) Resp:  [12-26] 18 (11/22 0724) BP: (93-130)/(57-74) 110/65 (11/22 0724) SpO2:  [98 %-100 %] 98 % (11/22 0724) Weight:  [201 lb (91.2 kg)] 201 lb (91.2 kg) (11/21 1028)  Physical Exam:  General: alert, cooperative and no distress Lochia: appropriate Uterine Fundus: firm Incision: Dressing intact -no drainage  DVT Evaluation: No evidence of DVT seen on physical exam. No significant calf/ankle edema.    Data Review Recent Labs    12/30/16 1128  HGB 12.2  HCT 37.0    Assessment:  Active Problems:   History of C-section   Status post Cesarean section. Doing well postoperatively.     Plan:       Continue current care  Out of bed, may shower, remove dressing.  Consider discharge tomorrow.Finis Bud, M.D. 12/31/2016 10:06 AM

## 2017-01-01 MED ORDER — SIMETHICONE 80 MG PO CHEW: 80.0000 mg | CHEWABLE_TABLET | Freq: Four times a day (QID) | ORAL | 0 refills | Status: DC

## 2017-01-01 MED ORDER — IBUPROFEN 600 MG PO TABS: 600.0000 mg | ORAL_TABLET | Freq: Four times a day (QID) | ORAL | 0 refills | Status: DC

## 2017-01-01 MED ORDER — OXYCODONE-ACETAMINOPHEN 5-325 MG PO TABS: 1.0000 | ORAL_TABLET | ORAL | 0 refills | Status: DC | PRN

## 2017-01-01 MED ORDER — SENNOSIDES-DOCUSATE SODIUM 8.6-50 MG PO TABS: 2.0000 | ORAL_TABLET | Freq: Every evening | ORAL | 0 refills | Status: DC | PRN

## 2017-01-01 NOTE — Final Progress Note (Signed)
Discharge Day SOAP Note:  Subjective:  The patient has no complaints.  She is ambulating well. She is taking PO well. Pain is well controlled with current medications. Patient is urinating without difficulty.   She is passing flatus.    Objective  Vital signs in last 24 hours: BP 114/67   Pulse 76   Temp 98.2 F (36.8 C) (Oral)   Resp 18   Ht 5' 6.5" (1.689 m)   Wt 201 lb (91.2 kg)   LMP 03/21/2016 (Approximate)   SpO2 98%   Breastfeeding? Unknown   BMI 31.96 kg/m   Physical Exam: Gen: NAD Abdomen: Soft, bowel sounds present, passing gas clean, dry, no drainage Fundus Fundal Tone: Firm  Lochia Amount: Small     Data Review Labs: CBC Latest Ref Rng & Units 12/30/2016 10/13/2016 06/16/2016  WBC 3.6 - 11.0 K/uL 6.6 4.7 5.0  Hemoglobin 12.0 - 16.0 g/dL 12.2 11.1 12.7  Hematocrit 35.0 - 47.0 % 37.0 33.7(L) 39.2  Platelets 150 - 440 K/uL 189 241 -   AB POS  Assessment:  Active Problems:   History of C-section   Doing well.  Normal progress as expected.    Plan:  Discharge to home  Modified rest as directed - may slowly resume normal activities with restrictions  as discussed.  Medications as written.  See below for additional.       Discharge Instructions: Per After Visit Summary. Activity: Advance as tolerated. Pelvic rest for 6 weeks.  Also refer to After Visit Summary.  Wound care discussed. Diet: Regular Medications: Allergies as of 01/01/2017   No Known Allergies     Medication List    TAKE these medications   ibuprofen 600 MG tablet Commonly known as:  ADVIL,MOTRIN Take 1 tablet (600 mg total) by mouth every 6 (six) hours.   multivitamin-prenatal 27-0.8 MG Tabs tablet Take 1 tablet by mouth daily at 12 noon.   oxyCODONE-acetaminophen 5-325 MG tablet Commonly known as:  PERCOCET/ROXICET Take 1-2 tablets by mouth every 4 (four) hours as needed (pain scale > 7).   ranitidine 150 MG tablet Commonly known as:  ZANTAC Take 1 tablet (150 mg  total) by mouth 2 (two) times daily.   senna-docusate 8.6-50 MG tablet Commonly known as:  Senokot-S Take 2 tablets by mouth at bedtime as needed for mild constipation.   simethicone 80 MG chewable tablet Commonly known as:  MYLICON Chew 1 tablet (80 mg total) by mouth 4 (four) times daily.      Outpatient follow up:  Postpartum contraception: Postpartum Tubal ligation   Discharged Condition: good  Discharged to: home  Newborn Data: Disposition:home with mother  Apgars: APGAR (1 MIN): 9   APGAR (5 MINS): 9   APGAR (10 MINS):    Baby Feeding: Breast  Philip Aspen, CNM  01/01/2017 7:46 AM

## 2017-01-01 NOTE — Discharge Summary (Signed)
   Obstetric Discharge Summary  Patient ID: Kim Wood MRN: [id number] DOB/AGE: [birth year] 41 y.o.   Date of Admission: 12/30/2016  Date of Discharge: 01/01/17  Admitting Diagnosis: Onset of Labor at [redacted]w[redacted]d  Mode of Delivery: repeat cesarean section       low uterine, transverse     Discharge Diagnosis: No other diagnosis   Intrapartum Procedures: Spinal   Post partum procedures: postpartum tubal ligation  Complications: N/A                        Discharge Day SOAP Note:  Subjective:  The patient has no complaints.  She is ambulating well. She is taking PO well. Pain is well controlled with current medications. Patient is urinating without difficulty.   She is passing flatus.    Objective  Vital signs in last 24 hours: BP 114/67   Pulse 76   Temp 98.2 F (36.8 C) (Oral)   Resp 18   Ht 5' 6.5" (1.689 m)   Wt 201 lb (91.2 kg)   LMP 03/21/2016 (Approximate)   SpO2 98%   Breastfeeding? Unknown   BMI 31.96 kg/m   Physical Exam: Gen: NAD Abdomen: Soft, bowel sounds present, passing gas clean, dry, no drainage Fundus Fundal Tone: Firm  Lochia Amount: Small     Data Review Labs: CBC Latest Ref Rng & Units 12/30/2016 10/13/2016 06/16/2016  WBC 3.6 - 11.0 K/uL 6.6 4.7 5.0  Hemoglobin 12.0 - 16.0 g/dL 12.2 11.1 12.7  Hematocrit 35.0 - 47.0 % 37.0 33.7(L) 39.2  Platelets 150 - 440 K/uL 189 241 -   AB POS  Assessment:  Active Problems:   History of C-section   Doing well.  Normal progress as expected.    Plan:  Discharge to home  Modified rest as directed - may slowly resume normal activities with restrictions  as discussed.  Medications as written.  See below for additional.       Discharge Instructions: Per After Visit Summary. Activity: Advance as tolerated. Pelvic rest for 6 weeks.  Also refer to After Visit Summary.  Wound care discussed. Diet: Regular Medications: Allergies as of 01/01/2017   No Known Allergies     Medication List    TAKE these medications   ibuprofen 600 MG tablet Commonly known as:  ADVIL,MOTRIN Take 1 tablet (600 mg total) by mouth every 6 (six) hours.   multivitamin-prenatal 27-0.8 MG Tabs tablet Take 1 tablet by mouth daily at 12 noon.   oxyCODONE-acetaminophen 5-325 MG tablet Commonly known as:  PERCOCET/ROXICET Take 1-2 tablets by mouth every 4 (four) hours as needed (pain scale > 7).   ranitidine 150 MG tablet Commonly known as:  ZANTAC Take 1 tablet (150 mg total) by mouth 2 (two) times daily.   senna-docusate 8.6-50 MG tablet Commonly known as:  Senokot-S Take 2 tablets by mouth at bedtime as needed for mild constipation.   simethicone 80 MG chewable tablet Commonly known as:  MYLICON Chew 1 tablet (80 mg total) by mouth 4 (four) times daily.      Outpatient follow up:  Postpartum contraception: Postpartum Tubal ligation   Discharged Condition: good  Discharged to: home  Newborn Data: Disposition:home with mother  Apgars: APGAR (1 MIN): 9   APGAR (5 MINS): 9   APGAR (10 MINS):    Baby Feeding: Breast  Philip Aspen, CNM  01/01/2017 7:46 AM

## 2017-01-02 NOTE — Discharge Summary (Signed)
Obstetric Discharge Summary  Patient ID: Kim Wood MRN: [id number] DOB/AGE: 43-Feb-1977 43 y.o.   Date of Admission: 12/30/2016  Date of Discharge: 01/02/17  Admitting Diagnosis: Onset of Labor at [redacted]w[redacted]d  Mode of Delivery: repeat cesarean section       low uterine, transverse                                      Discharge Diagnosis: No other diagnosis              Intrapartum Procedures: Spinal              Post partum procedures: postpartum tubal ligation  Complications: N/A                        Discharge Day SOAP Note:  Subjective:             The patient has no complaints.  She is ambulating well. She is taking PO well. Pain is well controlled with current medications. Patient is urinating without difficulty.   She is passing flatus. She decided yesterday that she wanted to stay for the additional help from nursing staff. She will go home today. She is doing well.     Objective  Vital signs in last 24 hours: BP 114/67   Pulse 76   Temp 98.2 F (36.8 C) (Oral)   Resp 18   Ht 5' 6.5" (1.689 m)   Wt 201 lb (91.2 kg)   LMP 03/21/2016 (Approximate)   SpO2 98%   Breastfeeding? Unknown   BMI 31.96 kg/m   Physical Exam: Gen: NAD Abdomen: Soft, bowel sounds present, passing gas clean, dry, no drainage Fundus Fundal Tone: Firm  Lochia Amount: Small     Data Review Labs: CBC Latest Ref Rng & Units 12/30/2016 10/13/2016 06/16/2016  WBC 3.6 - 11.0 K/uL 6.6 4.7 5.0  Hemoglobin 12.0 - 16.0 g/dL 12.2 11.1 12.7  Hematocrit 35.0 - 47.0 % 37.0 33.7(L) 39.2  Platelets 150 - 440 K/uL 189 241 -   AB POS  Assessment:             Active Problems:   History of C-section              Doing well.  Normal progress as expected.               Plan:             Discharge to home             Modified rest as directed - may slowly resume normal activities with restrictions    as discussed.             Medications as written.             See below  for additional.                  Discharge Instructions: Per After Visit Summary. Activity: Advance as tolerated. Pelvic rest for 6 weeks.  Also refer to After Visit Summary.  Wound care discussed. Diet: Regular Medications: Allergies as of 01/01/2017   No Known Allergies             Medication List     TAKE these medications   ibuprofen 600 MG tablet Commonly known as:  ADVIL,MOTRIN Take 1 tablet (600 mg total) by mouth every  6 (six) hours.   multivitamin-prenatal 27-0.8 MG Tabs tablet Take 1 tablet by mouth daily at 12 noon.   oxyCODONE-acetaminophen 5-325 MG tablet Commonly known as:  PERCOCET/ROXICET Take 1-2 tablets by mouth every 4 (four) hours as needed (pain scale > 7).   ranitidine 150 MG tablet Commonly known as:  ZANTAC Take 1 tablet (150 mg total) by mouth 2 (two) times daily.   senna-docusate 8.6-50 MG tablet Commonly known as:  Senokot-S Take 2 tablets by mouth at bedtime as needed for mild constipation.   simethicone 80 MG chewable tablet Commonly known as:  MYLICON Chew 1 tablet (80 mg total) by mouth 4 (four) times daily.      Outpatient follow up:  Postpartum contraception: Postpartum Tubal ligation   Discharged Condition: good  Discharged to: home  Newborn Data: Disposition:home with mother  Apgars: APGAR (1 MIN): 9   APGAR (5 MINS): 9   APGAR (10 MINS):    Baby Feeding: Breast  Philip Aspen, CNM  01/02/17

## 2017-01-02 NOTE — Progress Notes (Signed)
Reviewed D/C instructions with pt and family. Pt verbalized understanding of teaching. Discharged to home via W/C. Pt to schedule f/u appt.  

## 2017-01-11 ENCOUNTER — Telehealth: Payer: Self-pay | Admitting: Certified Nurse Midwife

## 2017-01-11 NOTE — Telephone Encounter (Signed)
The patient called and stated that she would like to speak with a nurse as soon a possible in regards to getting her prescription refilled and also having a form completed by her provider.The patient is also experiencing severe pain in her feet along with swelling. No other information was disclosed. Please advise.

## 2017-01-11 NOTE — Telephone Encounter (Signed)
Left message that call was returned and to contact office.

## 2017-01-12 ENCOUNTER — Other Ambulatory Visit: Payer: Self-pay

## 2017-01-12 ENCOUNTER — Ambulatory Visit (INDEPENDENT_AMBULATORY_CARE_PROVIDER_SITE_OTHER): Payer: BC Managed Care – PPO | Admitting: Certified Nurse Midwife

## 2017-01-12 ENCOUNTER — Encounter: Payer: Self-pay | Admitting: Certified Nurse Midwife

## 2017-01-12 VITALS — BP 142/90 | HR 67 | Wt 187.1 lb

## 2017-01-12 DIAGNOSIS — R030 Elevated blood-pressure reading, without diagnosis of hypertension: Secondary | ICD-10-CM

## 2017-01-12 DIAGNOSIS — M7989 Other specified soft tissue disorders: Secondary | ICD-10-CM | POA: Diagnosis not present

## 2017-01-12 DIAGNOSIS — M79606 Pain in leg, unspecified: Secondary | ICD-10-CM | POA: Diagnosis not present

## 2017-01-12 DIAGNOSIS — R102 Pelvic and perineal pain: Secondary | ICD-10-CM

## 2017-01-12 LAB — POCT URINALYSIS DIPSTICK
BILIRUBIN UA: NEGATIVE
Glucose, UA: NEGATIVE
KETONES UA: NEGATIVE
Leukocytes, UA: NEGATIVE
Nitrite, UA: NEGATIVE
PH UA: 6.5 (ref 5.0–8.0)
PROTEIN UA: NEGATIVE
RBC UA: NEGATIVE
Spec Grav, UA: 1.02 (ref 1.010–1.025)
Urobilinogen, UA: 0.2 E.U./dL

## 2017-01-12 MED ORDER — OXYCODONE-ACETAMINOPHEN 5-325 MG PO TABS: 1.0000 | ORAL_TABLET | Freq: Four times a day (QID) | ORAL | 0 refills | Status: DC | PRN

## 2017-01-12 NOTE — Patient Instructions (Signed)
Preeclampsia and Eclampsia °Preeclampsia is a serious condition that develops only during pregnancy. It is also called toxemia of pregnancy. This condition causes high blood pressure along with other symptoms, such as swelling and headaches. These symptoms may develop as the condition gets worse. Preeclampsia may occur at 20 weeks of pregnancy or later. °Diagnosing and treating preeclampsia early is very important. If not treated early, it can cause serious problems for you and your baby. One problem it can lead to is eclampsia, which is a condition that causes muscle jerking or shaking (convulsions or seizures) in the mother. Delivering your baby is the best treatment for preeclampsia or eclampsia. Preeclampsia and eclampsia symptoms usually go away after your baby is born. °What are the causes? °The cause of preeclampsia is not known. °What increases the risk? °The following risk factors make you more likely to develop preeclampsia: °· Being pregnant for the first time. °· Having had preeclampsia during a past pregnancy. °· Having a family history of preeclampsia. °· Having high blood pressure. °· Being pregnant with twins or triplets. °· Being 35 or older. °· Being African-American. °· Having kidney disease or diabetes. °· Having medical conditions such as lupus or blood diseases. °· Being very overweight (obese). ° °What are the signs or symptoms? °The earliest signs of preeclampsia are: °· High blood pressure. °· Increased protein in your urine. Your health care provider will check for this at every visit before you give birth (prenatal visit). ° °Other symptoms that may develop as the condition gets worse include: °· Severe headaches. °· Sudden weight gain. °· Swelling of the hands, face, legs, and feet. °· Nausea and vomiting. °· Vision problems, such as blurred or double vision. °· Numbness in the face, arms, legs, and feet. °· Urinating less than usual. °· Dizziness. °· Slurred speech. °· Abdominal pain,  especially upper abdominal pain. °· Convulsions or seizures. ° °Symptoms generally go away after giving birth. °How is this diagnosed? °There are no screening tests for preeclampsia. Your health care provider will ask you about symptoms and check for signs of preeclampsia during your prenatal visits. You may also have tests that include: °· Urine tests. °· Blood tests. °· Checking your blood pressure. °· Monitoring your baby’s heart rate. °· Ultrasound. ° °How is this treated? °You and your health care provider will determine the treatment approach that is best for you. Treatment may include: °· Having more frequent prenatal exams to check for signs of preeclampsia, if you have an increased risk for preeclampsia. °· Bed rest. °· Reducing how much salt (sodium) you eat. °· Medicine to lower your blood pressure. °· Staying in the hospital, if your condition is severe. There, treatment will focus on controlling your blood pressure and the amount of fluids in your body (fluid retention). °· You may need to take medicine (magnesium sulfate) to prevent seizures. This medicine may be given as an injection or through an IV tube. °· Delivering your baby early, if your condition gets worse. You may have your labor started with medicine (induced), or you may have a cesarean delivery. ° °Follow these instructions at home: °Eating and drinking ° °· Drink enough fluid to keep your urine clear or pale yellow. °· Eat a healthy diet that is low in sodium. Do not add salt to your food. Check nutrition labels to see how much sodium a food or beverage contains. °· Avoid caffeine. °Lifestyle °· Do not use any products that contain nicotine or tobacco, such as cigarettes   and e-cigarettes. If you need help quitting, ask your health care provider. °· Do not use alcohol or drugs. °· Avoid stress as much as possible. Rest and get plenty of sleep. °General instructions °· Take over-the-counter and prescription medicines only as told by your  health care provider. °· When lying down, lie on your side. This keeps pressure off of your baby. °· When sitting or lying down, raise (elevate) your feet. Try putting some pillows underneath your lower legs. °· Exercise regularly. Ask your health care provider what kinds of exercise are best for you. °· Keep all follow-up and prenatal visits as told by your health care provider. This is important. °How is this prevented? °To prevent preeclampsia or eclampsia from developing during another pregnancy: °· Get proper medical care during pregnancy. Your health care provider may be able to prevent preeclampsia or diagnose and treat it early. °· Your health care provider may have you take a low-dose aspirin or a calcium supplement during your next pregnancy. °· You may have tests of your blood pressure and kidney function after giving birth. °· Maintain a healthy weight. Ask your health care provider for help managing weight gain during pregnancy. °· Work with your health care provider to manage any long-term (chronic) health conditions you have, such as diabetes or kidney problems. ° °Contact a health care provider if: °· You gain more weight than expected. °· You have headaches. °· You have nausea or vomiting. °· You have abdominal pain. °· You feel dizzy or light-headed. °Get help right away if: °· You develop sudden or severe swelling anywhere in your body. This usually happens in the legs. °· You gain 5 lbs (2.3 kg) or more during one week. °· You have severe: °? Abdominal pain. °? Headaches. °? Dizziness. °? Vision problems. °? Confusion. °? Nausea or vomiting. °· You have a seizure. °· You have trouble moving any part of your body. °· You develop numbness in any part of your body. °· You have trouble speaking. °· You have any abnormal bleeding. °· You pass out. °This information is not intended to replace advice given to you by your health care provider. Make sure you discuss any questions you have with your health  care provider. °Document Released: 01/24/2000 Document Revised: 09/24/2015 Document Reviewed: 09/02/2015 °Elsevier Interactive Patient Education © 2018 Elsevier Inc. ° °

## 2017-01-12 NOTE — Progress Notes (Signed)
    OBSTETRICS/GYNECOLOGY POST-OPERATIVE CLINIC VISIT  Subjective:     Kim Wood is a 41 y.o. female who presents to the clinic 2 weeks status post repeat cesarean section with bilateral tubal ligation. Eating a regular diet without difficulty. Bowel movements are normal. Pain is not well controlled.  Medications being used: ibuprofen (OTC). Pt is breastfeeding without difficulty.   Reports intermittent headaches on Thursday, Friday, and Saturday; bilateral lower extremity swelling, and pelvic pain. No relief with home treatment measures. Out of percocet.   Denies difficulty breathing or respiratory distress, blurred vision or spots before eyes, dizziness, chest pain, abdominal pain, excessive vaginal bleeding, dysuria.   The following portions of the patient's history were reviewed and updated as appropriate: allergies, current medications, past family history, past medical history, past social history, past surgical history and problem list.  Review of Systems  Pertinent items are noted in HPI.   Objective:   BP (!) 142/90 (BP Location: Right Arm, Cuff Size: Normal)   Pulse 67   Wt 187 lb 1.6 oz (84.9 kg)   BMI 29.75 kg/m   General:  alert and no distress  Abdomen: soft, bowel sounds active, non-tender  Incision:   healing well, no drainage, no erythema, no hernia, no seroma, no swelling, no dehiscence, incision well approximated  Lower extremities Non-pitting edema present, right > left, negative Homan sign   Assessment:    Postoperative course complicated by bilateral lower extremity swelling, headache and elevated blood pressure   Plan:   Rx: Percocet, see orders.   Will order bilateral lower extremity ultrasound/doppler.   Labs: CBC, CMP, and protein/creatinie ratio.  Reviewed red flag symptoms and when to call.   RTC x 1-2 days for blood pressure check.   RTC x 4 weeks for PPV or sooner if needed.    Diona Fanti, CNM [facility]

## 2017-01-12 NOTE — Telephone Encounter (Signed)
Pt here today for an appt

## 2017-01-13 LAB — COMPREHENSIVE METABOLIC PANEL
A/G RATIO: 1.5 (ref 1.2–2.2)
ALT: 48 IU/L — AB (ref 0–32)
AST: 26 IU/L (ref 0–40)
Albumin: 3.8 g/dL (ref 3.5–5.5)
Alkaline Phosphatase: 84 IU/L (ref 39–117)
BUN/Creatinine Ratio: 13 (ref 9–23)
BUN: 9 mg/dL (ref 6–24)
CHLORIDE: 103 mmol/L (ref 96–106)
CO2: 20 mmol/L (ref 20–29)
Calcium: 8.7 mg/dL (ref 8.7–10.2)
Creatinine, Ser: 0.67 mg/dL (ref 0.57–1.00)
GFR calc Af Amer: 125 mL/min/{1.73_m2} (ref >59)
GFR calc non Af Amer: 108 mL/min/{1.73_m2} (ref >59)
GLOBULIN, TOTAL: 2.6 g/dL (ref 1.5–4.5)
Glucose: 82 mg/dL (ref 65–99)
Potassium: 4 mmol/L (ref 3.5–5.2)
SODIUM: 139 mmol/L (ref 134–144)
Total Protein: 6.4 g/dL (ref 6.0–8.5)

## 2017-01-13 LAB — CBC
Hematocrit: 27.9 % — ABNORMAL LOW (ref 34.0–46.6)
Hemoglobin: 9.1 g/dL — ABNORMAL LOW (ref 11.1–15.9)
MCH: 27.4 pg (ref 26.6–33.0)
MCHC: 32.6 g/dL (ref 31.5–35.7)
MCV: 84 fL (ref 79–97)
PLATELETS: 351 10*3/uL (ref 150–379)
RBC: 3.32 x10E6/uL — ABNORMAL LOW (ref 3.77–5.28)
RDW: 15.1 % (ref 12.3–15.4)
WBC: 4.6 10*3/uL (ref 3.4–10.8)

## 2017-01-13 LAB — PROTEIN / CREATININE RATIO, URINE
Creatinine, Urine: 37.5 mg/dL
PROTEIN UR: 4.3 mg/dL
PROTEIN/CREAT RATIO: 115 mg/g{creat} (ref 0–200)

## 2017-01-14 ENCOUNTER — Other Ambulatory Visit: Payer: Self-pay | Admitting: Certified Nurse Midwife

## 2017-01-14 DIAGNOSIS — M79605 Pain in left leg: Secondary | ICD-10-CM

## 2017-01-14 DIAGNOSIS — M7989 Other specified soft tissue disorders: Secondary | ICD-10-CM

## 2017-01-14 NOTE — Progress Notes (Signed)
Please contact. Labs negative for pre-eclampsia. Pt is anemic and should still be taking prenatal and iron supplement. Encourage to set up [place]. Thanks, JML

## 2017-01-15 ENCOUNTER — Ambulatory Visit: Admission: RE | Admit: 2017-01-15 | Payer: BC Managed Care – PPO | Source: Ambulatory Visit

## 2017-02-12 ENCOUNTER — Encounter: Payer: Self-pay | Admitting: Certified Nurse Midwife

## 2017-02-12 ENCOUNTER — Ambulatory Visit (INDEPENDENT_AMBULATORY_CARE_PROVIDER_SITE_OTHER): Payer: BC Managed Care – PPO | Admitting: Certified Nurse Midwife

## 2017-02-12 NOTE — Patient Instructions (Signed)
Preventive Care 40-64 Years, Female Preventive care refers to lifestyle choices and visits with your health care provider that can promote health and wellness. What does preventive care include?  A yearly physical exam. This is also called an annual well check.  Dental exams once or twice a year.  Routine eye exams. Ask your health care provider how often you should have your eyes checked.  Personal lifestyle choices, including: ? Daily care of your teeth and gums. ? Regular physical activity. ? Eating a healthy diet. ? Avoiding tobacco and drug use. ? Limiting alcohol use. ? Practicing safe sex. ? Taking low-dose aspirin daily starting at age 48. ? Taking vitamin and mineral supplements as recommended by your health care provider. What happens during an annual well check? The services and screenings done by your health care provider during your annual well check will depend on your age, overall health, lifestyle risk factors, and family history of disease. Counseling Your health care provider may ask you questions about your:  Alcohol use.  Tobacco use.  Drug use.  Emotional well-being.  Home and relationship well-being.  Sexual activity.  Eating habits.  Work and work Statistician.  Method of birth control.  Menstrual cycle.  Pregnancy history.  Screening You may have the following tests or measurements:  Height, weight, and BMI.  Blood pressure.  Lipid and cholesterol levels. These may be checked every 5 years, or more frequently if you are over 61 years old.  Skin check.  Lung cancer screening. You may have this screening every year starting at age 32 if you have a 30-pack-year history of smoking and currently smoke or have quit within the past 15 years.  Fecal occult blood test (FOBT) of the stool. You may have this test every year starting at age 23.  Flexible sigmoidoscopy or colonoscopy. You may have a sigmoidoscopy every 5 years or a colonoscopy  every 10 years starting at age 75.  Hepatitis C blood test.  Hepatitis B blood test.  Sexually transmitted disease (STD) testing.  Diabetes screening. This is done by checking your blood sugar (glucose) after you have not eaten for a while (fasting). You may have this done every 1-3 years.  Mammogram. This may be done every 1-2 years. Talk to your health care provider about when you should start having regular mammograms. This may depend on whether you have a family history of breast cancer.  BRCA-related cancer screening. This may be done if you have a family history of breast, ovarian, tubal, or peritoneal cancers.  Pelvic exam and Pap test. This may be done every 3 years starting at age 83. Starting at age 40, this may be done every 5 years if you have a Pap test in combination with an HPV test.  Bone density scan. This is done to screen for osteoporosis. You may have this scan if you are at high risk for osteoporosis.  Discuss your test results, treatment options, and if necessary, the need for more tests with your health care provider. Vaccines Your health care provider may recommend certain vaccines, such as:  Influenza vaccine. This is recommended every year.  Tetanus, diphtheria, and acellular pertussis (Tdap, Td) vaccine. You may need a Td booster every 10 years.  Varicella vaccine. You may need this if you have not been vaccinated.  Zoster vaccine. You may need this after age 59.  Measles, mumps, and rubella (MMR) vaccine. You may need at least one dose of MMR if you were born in  1959 or later. You may also need a second dose.  Pneumococcal 13-valent conjugate (PCV13) vaccine. You may need this if you have certain conditions and were not previously vaccinated.  Pneumococcal polysaccharide (PPSV23) vaccine. You may need one or two doses if you smoke cigarettes or if you have certain conditions.  Meningococcal vaccine. You may need this if you have certain  conditions.  Hepatitis A vaccine. You may need this if you have certain conditions or if you travel or work in places where you may be exposed to hepatitis A.  Hepatitis B vaccine. You may need this if you have certain conditions or if you travel or work in places where you may be exposed to hepatitis B.  Haemophilus influenzae type b (Hib) vaccine. You may need this if you have certain conditions.  Talk to your health care provider about which screenings and vaccines you need and how often you need them. This information is not intended to replace advice given to you by your health care provider. Make sure you discuss any questions you have with your health care provider. Document Released: 02/22/2015 Document Revised: 10/16/2015 Document Reviewed: 11/27/2014 Elsevier Interactive Patient Education  [employer].

## 2017-02-12 NOTE — Progress Notes (Signed)
Subjective:    Kim Wood is a 42 y.o. [phone] African American female who presents for a postpartum visit. She is 6 weeks postpartum following a repeat cesarean section, low transverse incision and bilateral tubal ligation at 39+0 gestational weeks. Anesthesia: spinal. I have fully reviewed the prenatal and intrapartum course.   Postpartum course has been uncomplicated. Baby's course has been uncomplicated. Baby is feeding by breast. Bleeding no bleeding. Bowel function is normal. Bladder function is normal.   Patient is not sexually active. Last sexual activity: prior to birth of infant. Contraception method is tubal ligation. Postpartum depression screening: negative. Score 3.  Last pap 2016 and was normal.  The following portions of the patient's history were reviewed and updated as appropriate: allergies, current medications, past medical history, past surgical history and problem list.  Review of Systems  Pertinent items are noted in HPI.   Objective:   BP 140/80   Pulse 88   Ht 5' 6.5" (1.689 m)   Wt 186 lb 3.2 oz (84.5 kg)   LMP  (LMP Unknown)   Breastfeeding? Yes   BMI 29.60 kg/m   General:  alert, cooperative and no distress   Breasts:  deferred, no complaints  Lungs: clear to auscultation bilaterally  Heart:  regular rate and rhythm  Abdomen: soft, nontender   Vulva: normal  Vagina: normal vagina  Cervix:  closed  Corpus: Well-involuted  Adnexa:  Non-palpable   Depression screen PHQ 2/9 02/12/2017  Decreased Interest 0  Down, Depressed, Hopeless 0  PHQ - 2 Score 0  Altered sleeping 1  Tired, decreased energy 1  Change in appetite 0  Feeling bad or failure about yourself  0  Trouble concentrating 0  Moving slowly or fidgety/restless 1  Suicidal thoughts 0  PHQ-9 Score 3       Assessment:   Postpartum exam Six (6) wks s/p repeat c-section with bilateral tubal ligation Breastfeeding Depression screening Contraception counseling   Plan:   May  return to work.   Reviewed red flag symptoms and when to call.   Follow up in: 5 months for Annual Exam or earlier if needed.   Diona Fanti, CNM [facility], [facility]

## 2017-03-03 ENCOUNTER — Telehealth: Payer: Self-pay | Admitting: Certified Nurse Midwife

## 2017-03-03 NOTE — Telephone Encounter (Signed)
The patient called in regards to her being released to go back to work. No other information was disclosed other than the patient wanting to speak with a nurse. Please advise.

## 2017-03-03 NOTE — Telephone Encounter (Signed)
Pt aware letter left up front for p/u.

## 2017-05-30 ENCOUNTER — Emergency Department
Admission: EM | Admit: 2017-05-30 | Discharge: 2017-05-30 | Disposition: A | Discharge status: Home / Self Care | Payer: BC Managed Care – PPO | Attending: Emergency Medicine | Admitting: Emergency Medicine

## 2017-05-30 ENCOUNTER — Emergency Department: Payer: BC Managed Care – PPO

## 2017-05-30 ENCOUNTER — Encounter: Payer: Self-pay | Admitting: Emergency Medicine

## 2017-05-30 ENCOUNTER — Other Ambulatory Visit: Payer: Self-pay

## 2017-05-30 DIAGNOSIS — S52572A Other intraarticular fracture of lower end of left radius, initial encounter for closed fracture: Secondary | ICD-10-CM | POA: Diagnosis not present

## 2017-05-30 DIAGNOSIS — W010XXA Fall on same level from slipping, tripping and stumbling without subsequent striking against object, initial encounter: Secondary | ICD-10-CM | POA: Insufficient documentation

## 2017-05-30 DIAGNOSIS — Z79899 Other long term (current) drug therapy: Secondary | ICD-10-CM | POA: Diagnosis not present

## 2017-05-30 DIAGNOSIS — Y998 Other external cause status: Secondary | ICD-10-CM | POA: Diagnosis not present

## 2017-05-30 DIAGNOSIS — Y9351 Activity, roller skating (inline) and skateboarding: Secondary | ICD-10-CM | POA: Insufficient documentation

## 2017-05-30 DIAGNOSIS — Y929 Unspecified place or not applicable: Secondary | ICD-10-CM | POA: Diagnosis not present

## 2017-05-30 DIAGNOSIS — S6992XA Unspecified injury of left wrist, hand and finger(s), initial encounter: Secondary | ICD-10-CM | POA: Diagnosis present

## 2017-05-30 MED ORDER — OXYCODONE-ACETAMINOPHEN 5-325 MG PO TABS: 1.0000 | ORAL_TABLET | Freq: Four times a day (QID) | ORAL | 0 refills | Status: AC | PRN

## 2017-05-30 MED ORDER — OXYCODONE-ACETAMINOPHEN 5-325 MG PO TABS
1.0000 | ORAL_TABLET | Freq: Once | ORAL | Status: AC
  Administered 2017-05-30: 1 via ORAL
  Filled 2017-05-30: qty 1

## 2017-05-30 MED ORDER — MELOXICAM 15 MG PO TABS: 15.0000 mg | ORAL_TABLET | Freq: Every day | ORAL | 0 refills | Status: AC

## 2017-05-30 NOTE — ED Triage Notes (Signed)
Pt c/o left wrist pain after falling while roller skating last night; pain and swelling present; pain worse with movement

## 2017-05-30 NOTE — ED Provider Notes (Signed)
[facility] Emergency Department Provider Note  ____________________________________________  Time seen: Approximately 10:22 PM  I have reviewed the triage vital signs and the nursing notes.   HISTORY  Chief Complaint Wrist Pain    HPI Kim Wood is a 42 y.o. female who presents the emergency department complaining of pain and swelling to the left wrist.  Patient was skating with her son last night, was tripped up by a young scanner, fell trying to catch herself on a extended left wrist.  Patient reports that initially she had some pain to the area but thought it was more sprained than anything.  Patient reports that the pain and the swelling has increased this evening.  She has limited range of motion due to pain.  No numbness or tingling.  She denies any other pain complaint.  She did not lose consciousness.  Past Medical History:  Diagnosis Date  . Medical history non-contributory     Patient Active Problem List   Diagnosis Date Noted  . History of C-section 12/30/2016    Past Surgical History:  Procedure Laterality Date  . CESAREAN SECTION     x2  . CESAREAN SECTION WITH BILATERAL TUBAL LIGATION N/A 12/30/2016   Procedure: CESAREAN SECTION WITH BILATERAL TUBAL LIGATION;  Surgeon: Harlin Heys, MD;  Location: [facility] ORS;  Service: Obstetrics;  Laterality: N/A;    Prior to Admission medications   Medication Sig Start Date End Date Taking? Authorizing Provider  Prenatal Vit-Fe Fumarate-FA (MULTIVITAMIN-PRENATAL) 27-0.8 MG TABS tablet Take 1 tablet by mouth daily at 12 noon.   Yes [provider]    Allergies Patient has no known allergies.  Family History  Problem Relation Age of Onset  . Hypertension Father   . Seizures Brother   . Stroke Maternal Grandmother   . Rheum arthritis Paternal Grandmother   . Diabetes Paternal Grandmother     Social History Social History   Tobacco Use  . Smoking status: Never Smoker   . Smokeless tobacco: Never Used  Substance Use Topics  . Alcohol use: No  . Drug use: No     Review of Systems  Constitutional: No fever/chills Eyes: No visual changes.  Cardiovascular: no chest pain. Respiratory: no cough. No SOB. Gastrointestinal: No abdominal pain.  No nausea, no vomiting.  Musculoskeletal: Positive for left wrist injury/pain Skin: Negative for rash, abrasions, lacerations, ecchymosis. Neurological: Negative for headaches, focal weakness or numbness. 10-point ROS otherwise negative.  ____________________________________________   PHYSICAL EXAM:  VITAL SIGNS: ED Triage Vitals [05/30/17 2126]  Enc Vitals Group     BP 127/82     Pulse Rate 82     Resp 17     Temp 98.3 F (36.8 C)     Temp Source Oral     SpO2 100 %     Weight 189 lb (85.7 kg)     Height 5' 6.5" (1.689 m)     Head Circumference      Peak Flow      Pain Score 8     Pain Loc      Pain Edu?      Excl. in [place]?      Constitutional: Alert and oriented. Well appearing and in no acute distress. Eyes: Conjunctivae are normal. PERRL. EOMI. Head: Atraumatic. Neck: No stridor.    Cardiovascular: Normal rate, regular rhythm. Normal S1 and S2.  Good peripheral circulation. Respiratory: Normal respiratory effort without tachypnea or retractions. Lungs CTAB. Good air entry to the  bases with no decreased or absent breath sounds. Musculoskeletal: Full range of motion to all extremities. No gross deformities appreciated.  Mild edema noted to the left wrist.  No ecchymosis or deformity.  Patient is exquisitely tender to palpation of the distal radius with no palpable abnormality.  No other tenderness to palpation.  Patient is able to extend and flex digits appropriately.  Examination of the elbow is unremarkable.  Radial pulse intact.  Sensation intact all 5 digits. Neurologic:  Normal speech and language. No gross focal neurologic deficits are appreciated.  Skin:  Skin is warm, dry and intact. No  rash noted. Psychiatric: Mood and affect are normal. Speech and behavior are normal. Patient exhibits appropriate insight and judgement.   ____________________________________________   LABS (all labs ordered are listed, but only abnormal results are displayed)  Labs Reviewed - No data to display ____________________________________________  EKG   ____________________________________________  RADIOLOGY Diamantina [facility], personally viewed and evaluated these images (plain radiographs) as part of my medical decision making, as well as reviewing the written report by the radiologist.  Dg Wrist Complete Left  Result Date: 05/30/2017 CLINICAL DATA:  Left wrist pain.  Fall roller-skating. EXAM: LEFT WRIST - COMPLETE 3+ VIEW COMPARISON:  None. FINDINGS: Posterior soft tissue swelling. Question subtle lucency through the distal left radius. No visible ulnar abnormality. No subluxation or dislocation. IMPRESSION: Linear lucency in the distal left radius, question subtle nondisplaced distal radial fracture. Posterior soft tissue swelling. Electronically Signed   By: Rolm Baptise M.D.   On: 05/30/2017 21:51    ____________________________________________    PROCEDURES  Procedure(s) performed:    .Splint Application Date/Time: 3/82/5053 10:45 PM Performed by: Darletta Moll, PA-C Authorized by: Darletta Moll, PA-C   Consent:    Consent obtained:  Verbal   Consent given by:  Patient   Risks discussed:  Pain and swelling Pre-procedure details:    Sensation:  Normal Procedure details:    Laterality:  Left   Location:  Wrist   Wrist:  L wrist   Splint type:  Volar short arm   Supplies:  Cotton padding, Ortho-Glass and elastic bandage Post-procedure details:    Pain:  Improved   Sensation:  Normal   Patient tolerance of procedure:  Tolerated well, no immediate complications      Medications  oxyCODONE-acetaminophen (PERCOCET/ROXICET) 5-325 MG per  tablet 1 tablet (1 tablet Oral Given 05/30/17 2244)     ____________________________________________   INITIAL IMPRESSION / ASSESSMENT AND PLAN / ED COURSE  Pertinent labs & imaging results that were available during my care of the patient were reviewed by me and considered in my medical decision making (see chart for details).  Review of the [place] CSRS was performed in accordance of the [place] prior to dispensing any controlled drugs.     Patient's diagnosis is consistent with a closed intra-articular fracture of the distal left radius.  Patient presents with wrist pain status post fall last night.  Differential included contusion, sprain, fracture.  X-ray reveals the above diagnosis.  Patient's wrist is splinted in the emergency department.. Patient will be discharged home with prescriptions for meloxicam and Percocet. Patient is to follow up with orthopedics as needed or otherwise directed. Patient is given ED precautions to return to the ED for any worsening or new symptoms.     ____________________________________________  FINAL CLINICAL IMPRESSION(S) / ED DIAGNOSES  Final diagnoses:  Other closed intra-articular fracture of distal end of left radius, initial encounter  NEW MEDICATIONS STARTED DURING THIS VISIT:  ED Discharge Orders    None          This chart was dictated using voice recognition software/Dragon. Despite best efforts to proofread, errors can occur which can change the meaning. Any change was purely unintentional.    Darletta Moll, PA-C 05/30/17 2247    Nance Pear, MD 05/30/17 2253

## 2017-10-06 IMAGING — US US OB TRANSVAGINAL
1 series · 14 of 28 positions shown · non-contrast
Comparison: None.

CLINICAL DATA: 40 y/o F; 3 days of lower abdominal pain and vaginal
bleeding. Quantitative beta HCG [DATE].

EXAM:
OBSTETRIC <14 WK US AND TRANSVAGINAL OB US
TECHNIQUE: Both transabdominal and transvaginal ultrasound examinations were
performed for complete evaluation of the gestation as well as the
maternal uterus, adnexal regions, and pelvic cul-de-sac.
Transvaginal technique was performed to assess early pregnancy.

[Series 1: us ob transvaginal · 0.21mm/px · 14 of 96 slices shown]
[im 4/96]
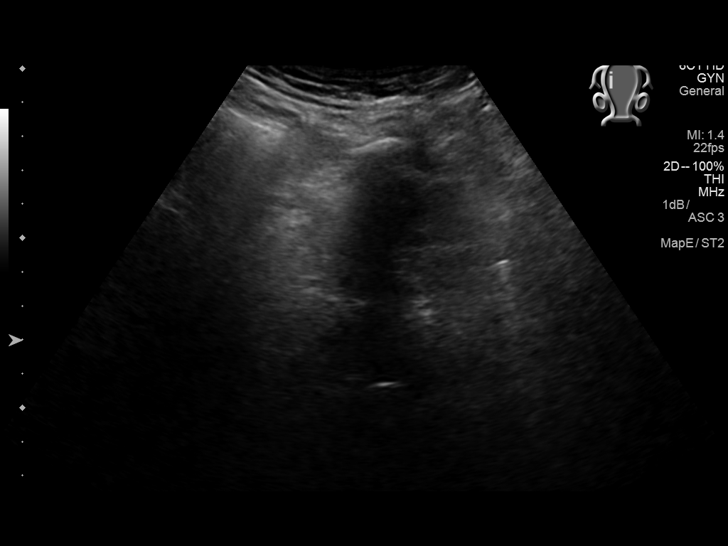
[im 11/96]
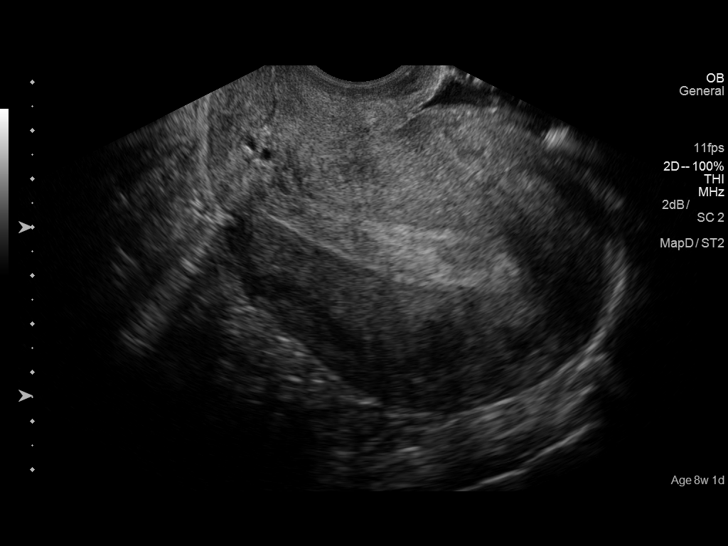
[im 18/96]
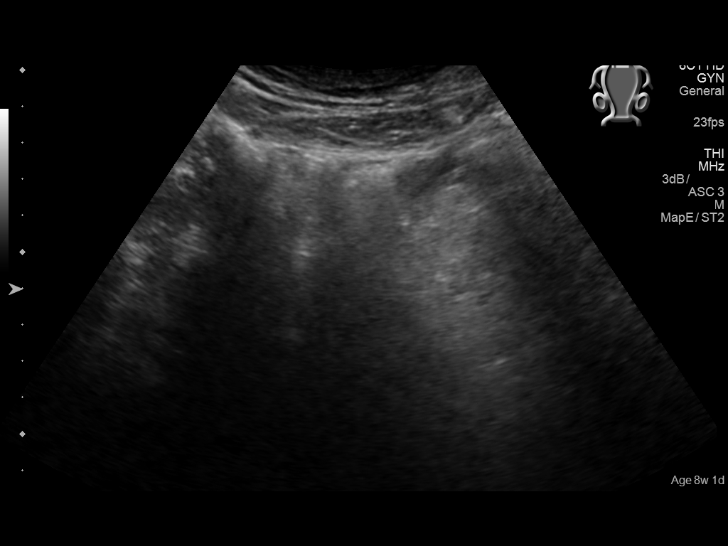
[im 25/96]
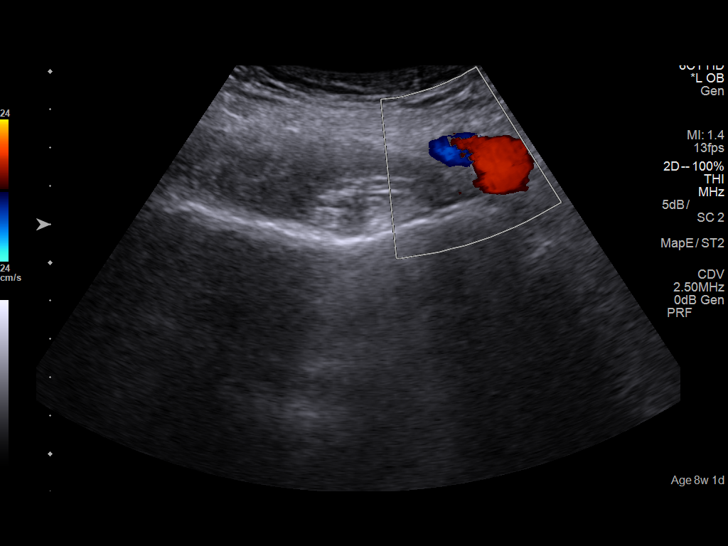
[im 32/96]
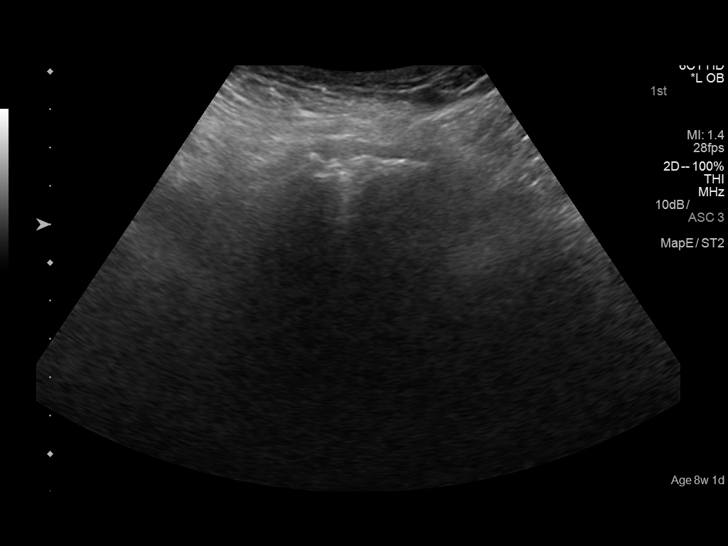
[im 39/96]
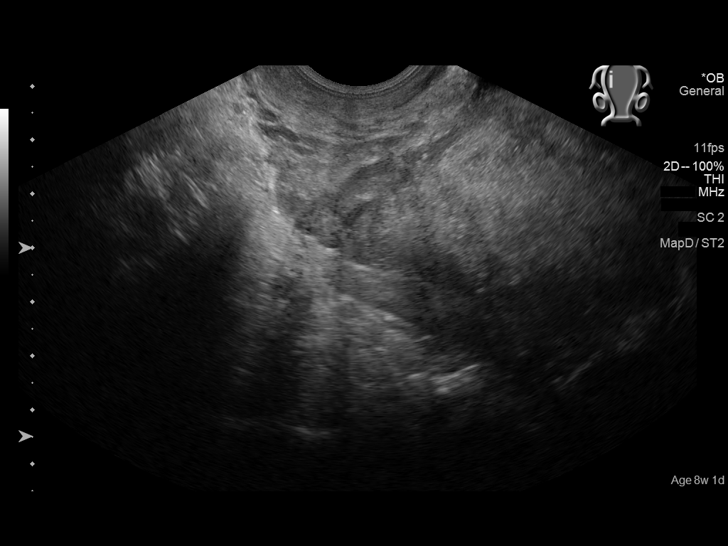
[im 46/96]
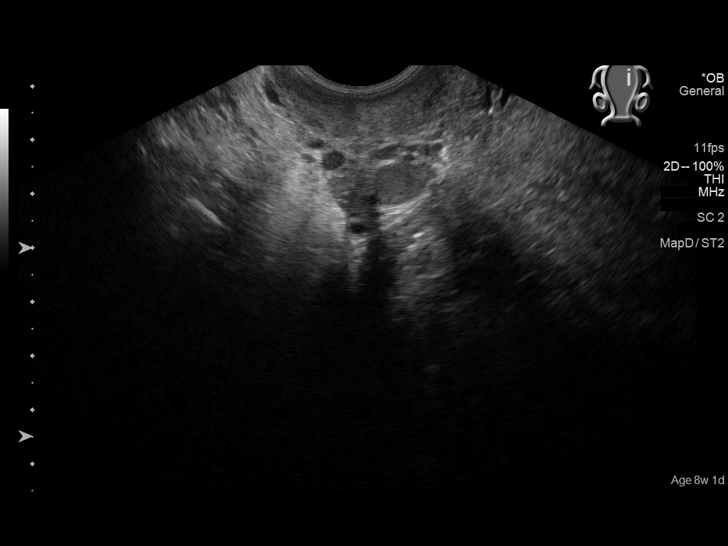
[im 53/96]
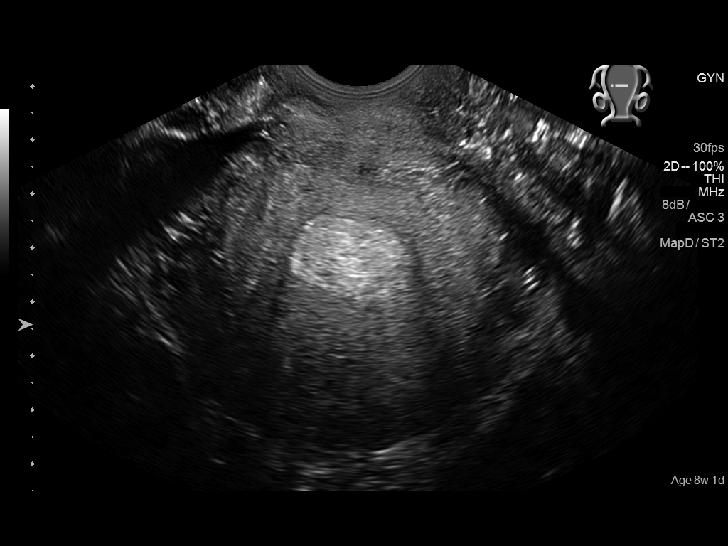
[im 60/96]
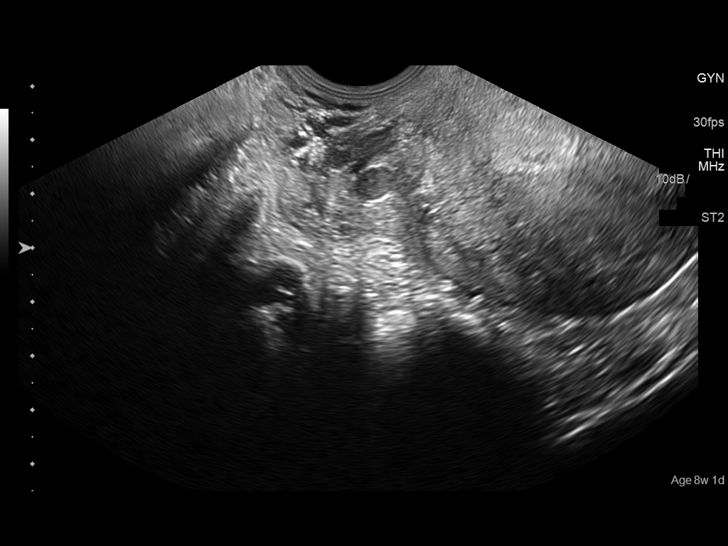
[im 67/96]
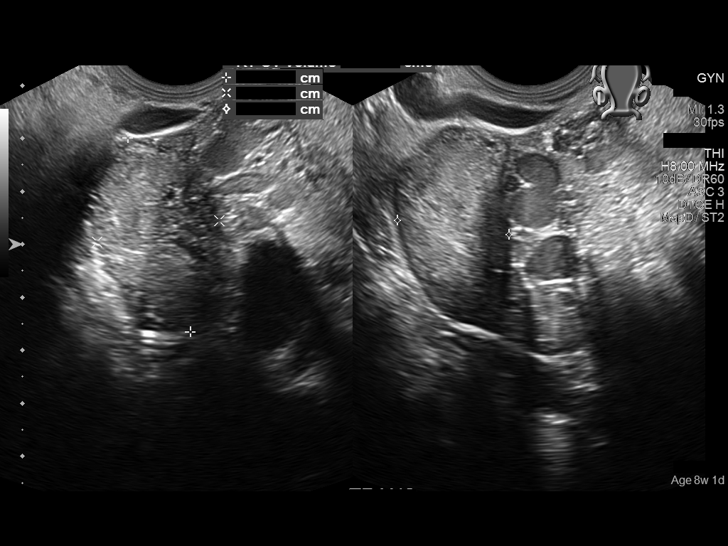
[im 74/96]
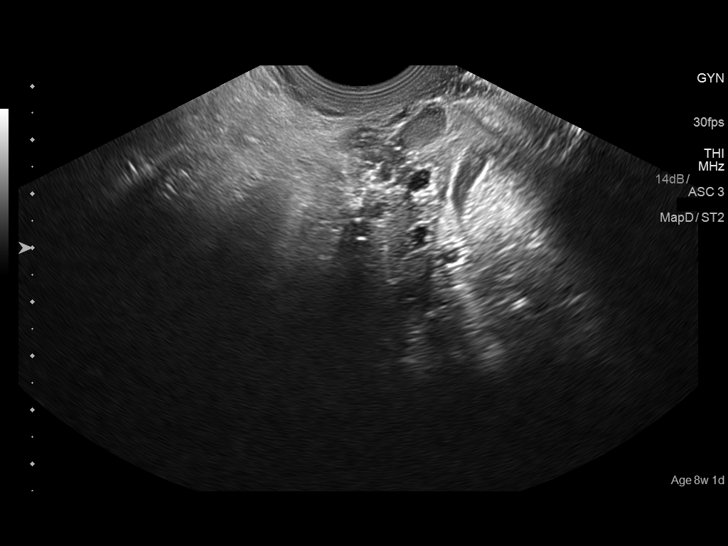
[im 81/96]
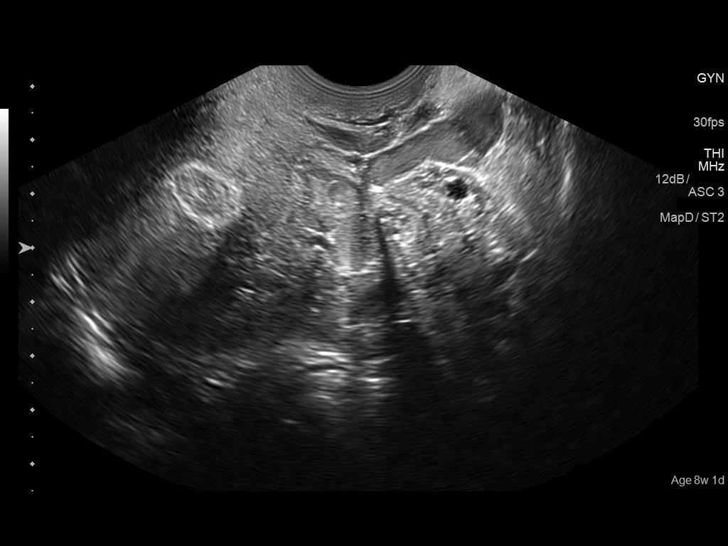
[im 88/96]
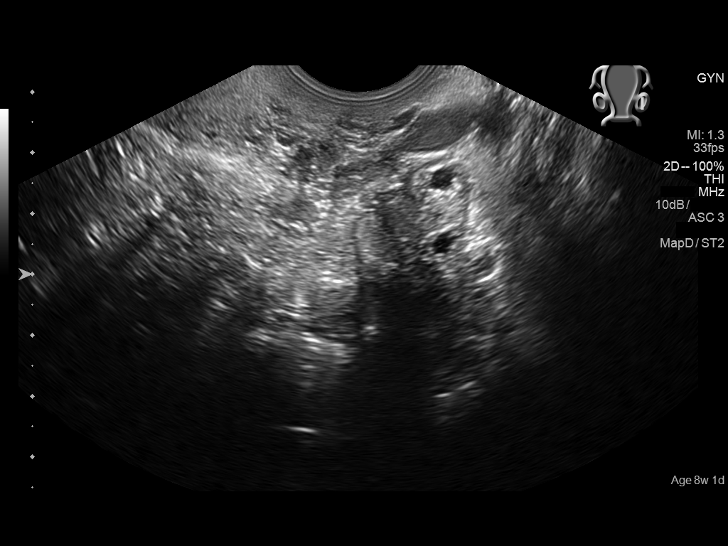
[im 96/96]
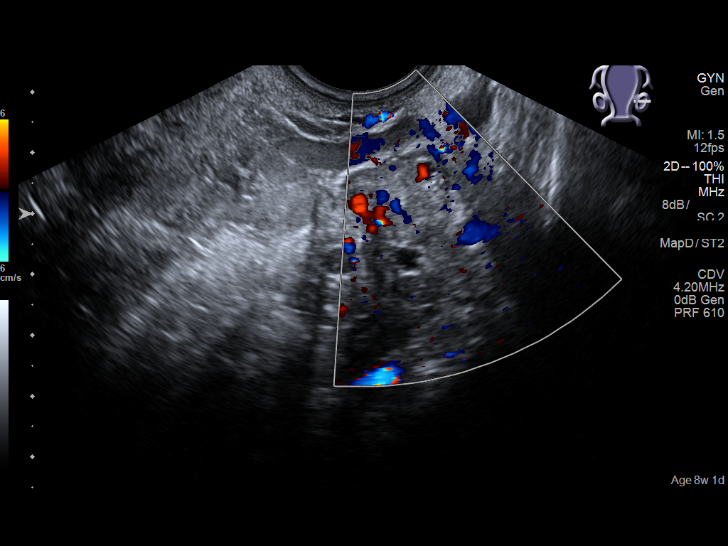

[14 of 28 positions shown; findings below may reference images not displayed]

FINDINGS: Intrauterine gestational sac: None

Yolk sac:  Not Visualized.

Embryo:  Not Visualized.

Cardiac Activity: Not Visualized.

Subchorionic hemorrhage:  None visualized.

Maternal uterus/adnexae: Partially obscured adnexa due to overlying
bowel gas. Bilateral ovaries appear normal with blood flow present
on color and spectral Doppler.
IMPRESSION: Pregnancy of unknown location. If Beta hCG measurement is >6000, a
viable intrauterine pregnancy is possible, but unlikely. Follow-up
beta HCG and pelvic ultrasound as indicated is recommended. This
recommendation follows SRU consensus guidelines: Diagnostic Criteria
for Nonviable Pregnancy Early in the First Trimester. N Engl J Med

By: Keilane Sandre M.D.

## 2018-05-19 ENCOUNTER — Telehealth: Payer: Self-pay | Admitting: General Practice

## 2018-05-19 NOTE — Telephone Encounter (Signed)
Faxed request and Release of Information to billing from [place] for billing statement from 06/2016 to present.

## 2019-02-06 ENCOUNTER — Ambulatory Visit: Payer: BC Managed Care – PPO | Attending: Internal Medicine

## 2019-02-06 DIAGNOSIS — Z20822 Contact with and (suspected) exposure to covid-19: Secondary | ICD-10-CM

## 2019-02-08 LAB — NOVEL CORONAVIRUS, NAA: SARS-CoV-2, NAA: NOT DETECTED

## 2019-03-08 ENCOUNTER — Ambulatory Visit: Payer: BC Managed Care – PPO | Attending: Internal Medicine

## 2019-03-08 DIAGNOSIS — Z20822 Contact with and (suspected) exposure to covid-19: Secondary | ICD-10-CM

## 2019-03-10 ENCOUNTER — Telehealth: Payer: Self-pay | Admitting: General Practice

## 2019-03-10 LAB — NOVEL CORONAVIRUS, NAA: SARS-CoV-2, NAA: NOT DETECTED

## 2019-03-10 NOTE — Telephone Encounter (Signed)
Negative COVID results given. Patient results "NOT Detected." Caller expressed understanding. ° °

## 2019-04-22 ENCOUNTER — Ambulatory Visit: Payer: BC Managed Care – PPO | Attending: Internal Medicine

## 2019-04-22 ENCOUNTER — Other Ambulatory Visit: Payer: Self-pay

## 2019-04-22 DIAGNOSIS — Z23 Encounter for immunization: Secondary | ICD-10-CM

## 2019-04-22 NOTE — Progress Notes (Signed)
   Covid-19 Vaccination Clinic  Name:  Nasaria Minckler    MRN: [id number] DOB: [birth year]  04/22/2019  Ms. Foust Layne Benton was observed post Covid-19 immunization for 15 minutes without incident. She was provided with Vaccine Information Sheet and instruction to access the V-Safe system.   Ms. Breeley Pepple was instructed to call 911 with any severe reactions post vaccine: Marland Kitchen Difficulty breathing  . Swelling of face and throat  . A fast heartbeat  . A bad rash all over body  . Dizziness and weakness   Immunizations Administered    Name Date Dose VIS Date Route   Pfizer COVID-19 Vaccine 04/22/2019  3:01 PM 0.3 mL 01/20/2019 Intramuscular   Manufacturer: [place]   Lot: [id number]   [place]: [id number]

## 2019-05-17 ENCOUNTER — Ambulatory Visit: Payer: BC Managed Care – PPO | Attending: Internal Medicine

## 2019-05-17 ENCOUNTER — Ambulatory Visit: Payer: BC Managed Care – PPO

## 2019-05-17 DIAGNOSIS — Z23 Encounter for immunization: Secondary | ICD-10-CM

## 2019-05-17 NOTE — Progress Notes (Signed)
   Covid-19 Vaccination Clinic  Name:  Kim Wood    MRN: [id number] DOB: 03/02/1975  05/17/2019  Ms. Kim Wood was observed post Covid-19 immunization for 15 minutes without incident. She was provided with Vaccine Information Sheet and instruction to access the V-Safe system.   Ms. Kim Wood was instructed to call 911 with any severe reactions post vaccine: Marland Kitchen Difficulty breathing  . Swelling of face and throat  . A fast heartbeat  . A bad rash all over body  . Dizziness and weakness   Immunizations Administered    Name Date Dose VIS Date Route   Pfizer COVID-19 Vaccine 05/17/2019  3:57 PM 0.3 mL 01/20/2019 Intramuscular   Manufacturer: [place]   Lot: [id number]   [place]: [id number]

## 2020-11-06 ENCOUNTER — Ambulatory Visit (INDEPENDENT_AMBULATORY_CARE_PROVIDER_SITE_OTHER): Payer: BC Managed Care – PPO | Admitting: Certified Nurse Midwife

## 2020-11-06 ENCOUNTER — Other Ambulatory Visit: Payer: Self-pay

## 2020-11-06 ENCOUNTER — Other Ambulatory Visit (HOSPITAL_COMMUNITY)
Admission: RE | Admit: 2020-11-06 | Discharge: 2020-11-06 | Disposition: A | Discharge status: Home / Self Care | Payer: BC Managed Care – PPO | Source: Ambulatory Visit | Attending: Certified Nurse Midwife | Admitting: Certified Nurse Midwife

## 2020-11-06 ENCOUNTER — Encounter: Payer: Self-pay | Admitting: Certified Nurse Midwife

## 2020-11-06 VITALS — BP 158/103 | HR 78 | Ht 67.0 in | Wt 197.7 lb

## 2020-11-06 DIAGNOSIS — Z13220 Encounter for screening for lipoid disorders: Secondary | ICD-10-CM

## 2020-11-06 DIAGNOSIS — Z113 Encounter for screening for infections with a predominantly sexual mode of transmission: Secondary | ICD-10-CM

## 2020-11-06 DIAGNOSIS — Z23 Encounter for immunization: Secondary | ICD-10-CM

## 2020-11-06 DIAGNOSIS — Z124 Encounter for screening for malignant neoplasm of cervix: Secondary | ICD-10-CM | POA: Diagnosis not present

## 2020-11-06 DIAGNOSIS — Z01419 Encounter for gynecological examination (general) (routine) without abnormal findings: Secondary | ICD-10-CM | POA: Insufficient documentation

## 2020-11-06 DIAGNOSIS — E669 Obesity, unspecified: Secondary | ICD-10-CM

## 2020-11-06 DIAGNOSIS — Z1231 Encounter for screening mammogram for malignant neoplasm of breast: Secondary | ICD-10-CM

## 2020-11-06 DIAGNOSIS — D259 Leiomyoma of uterus, unspecified: Secondary | ICD-10-CM

## 2020-11-06 NOTE — Progress Notes (Signed)
GYNECOLOGY ANNUAL PREVENTATIVE CARE ENCOUNTER NOTE  History:     Kim Wood is a 45 y.o. [phone] female here for a routine annual gynecologic exam.  Current complaints: pelvic pressure hx uterine fibroids..   Denies abnormal vaginal bleeding, discharge, pelvic pain, problems with intercourse or other gynecologic concerns.     Social Relationship:married Living: spouse and children Work: teacher-reading Exercise: 4x wk  Smoke/Alcohol/drug [id number] use   Gynecologic History No LMP recorded. Contraception: tubal ligation Last Pap: 2016. Results were: normal  Last mammogram: 2016. Results were: normal  Obstetric History OB History  Gravida Para Term Preterm AB Living  4 3 3   1 3   SAB IAB Ectopic Multiple Live Births      1 0 3    # Outcome Date GA Lbr Len/2nd Weight Sex Delivery Anes PTL Lv  4 Term 12/30/16 [redacted]w[redacted]d  7 lb 1.9 oz (3.23 kg) F CS-LTranv Spinal  LIV  3 Ectopic 2018          2 Term 2014 [redacted]w[redacted]d  6 lb 11 oz (3.033 kg) M CS-Unspec Spinal Y LIV  1 Term 2012 [redacted]w[redacted]d  6 lb 9 oz (2.977 kg) M CS-Unspec Spinal N LIV     Complications: Placenta Previa    Past Medical History:  Diagnosis Date   Medical history non-contributory     Past Surgical History:  Procedure Laterality Date   CESAREAN SECTION     x2   CESAREAN SECTION WITH BILATERAL TUBAL LIGATION N/A 12/30/2016   Procedure: CESAREAN SECTION WITH BILATERAL TUBAL LIGATION;  Surgeon: Harlin Heys, MD;  Location: [facility] ORS;  Service: Obstetrics;  Laterality: N/A;    Current Outpatient Medications on File Prior to Visit  Medication Sig Dispense Refill   meloxicam (MOBIC) 15 MG tablet Take 1 tablet (15 mg total) by mouth daily. 30 tablet 0   oxyCODONE-acetaminophen (PERCOCET/ROXICET) 5-325 MG tablet Take 1 tablet by mouth every 6 (six) hours as needed for severe pain. 20 tablet 0   Prenatal Vit-Fe Fumarate-FA (MULTIVITAMIN-PRENATAL) 27-0.8 MG TABS tablet Take 1 tablet by mouth daily at 12 noon.      No current facility-administered medications on file prior to visit.    No Known Allergies  Social History:  reports that she has never smoked. She has never used smokeless tobacco. She reports that she does not drink alcohol and does not use drugs.  Family History  Problem Relation Age of Onset   Hypertension Father    Seizures Brother    Stroke Maternal Grandmother    Rheum arthritis Paternal Grandmother    Diabetes Paternal Grandmother     The following portions of the patient's history were reviewed and updated as appropriate: allergies, current medications, past family history, past medical history, past social history, past surgical history and problem list.  Review of Systems Pertinent items noted in HPI and remainder of comprehensive ROS otherwise negative.  Physical Exam:  There were no vitals taken for this visit. CONSTITUTIONAL: Well-developed, well-nourished female in no acute distress.  HENT:  Normocephalic, atraumatic, External right and left ear normal. Oropharynx is clear and moist EYES: Conjunctivae and EOM are normal. Pupils are equal, round, and reactive to light. No scleral icterus.  NECK: Normal range of motion, supple, no masses.  Normal thyroid.  SKIN: Skin is warm and dry. No rash noted. Not diaphoretic. No erythema. No pallor. MUSCULOSKELETAL: Normal range of motion. No tenderness.  No cyanosis, clubbing, or edema.  2+ distal pulses. NEUROLOGIC:  Alert and oriented to person, place, and time. Normal reflexes, muscle tone coordination.  PSYCHIATRIC: Normal mood and affect. Normal behavior. Normal judgment and thought content. CARDIOVASCULAR: Normal heart rate noted, regular rhythm RESPIRATORY: Clear to auscultation bilaterally. Effort and breath sounds normal, no problems with respiration noted. BREASTS: Symmetric in size. No masses, tenderness, skin changes, nipple drainage, or lymphadenopathy bilaterally.  ABDOMEN: Soft, no distention noted.  No  tenderness, rebound or guarding.  PELVIC: Normal appearing external genitalia and urethral meatus; normal appearing vaginal mucosa and cervix.  No abnormal discharge noted.  Pap smear obtained.  Normal uterine size, no other palpable masses, no uterine or adnexal tenderness.  .   Assessment and Plan:    1. Women's annual routine gynecological examination  Pap: Will follow up results of pap smear and manage accordingly. Mammogram : ordered Labs: hep c, lipid , TSH, hem A1c Pelvic u/s order evaluate change fiborid Refills: none Referral: none Routine preventative health maintenance measures emphasized. Please refer to After Visit Summary for other counseling recommendations.      Philip Aspen, CNM [facility]

## 2020-11-06 NOTE — Patient Instructions (Signed)
Preventive Care 40-47 Years Old, Female Preventive care refers to lifestyle choices and visits with your health care provider that can promote health and wellness. This includes: A yearly physical exam. This is also called an annual wellness visit. Regular dental and eye exams. Immunizations. Screening for certain conditions. Healthy lifestyle choices, such as: Eating a healthy diet. Getting regular exercise. Not using drugs or products that contain nicotine and tobacco. Limiting alcohol use. What can I expect for my preventive care visit? Physical exam Your health care provider will check your: Height and weight. These may be used to calculate your BMI (body mass index). BMI is a measurement that tells if you are at a healthy weight. Heart rate and blood pressure. Body temperature. Skin for abnormal spots. Counseling Your health care provider may ask you questions about your: Past medical problems. Family's medical history. Alcohol, tobacco, and drug use. Emotional well-being. Home life and relationship well-being. Sexual activity. Diet, exercise, and sleep habits. Work and work environment. Access to firearms. Method of birth control. Menstrual cycle. Pregnancy history. What immunizations do I need? Vaccines are usually given at various ages, according to a schedule. Your health care provider will recommend vaccines for you based on your age, medical history, and lifestyle or other factors, such as travel or where you work. What tests do I need? Blood tests Lipid and cholesterol levels. These may be checked every 5 years, or more often if you are over 50 years old. Hepatitis C test. Hepatitis B test. Screening Lung cancer screening. You may have this screening every year starting at age 55 if you have a 30-pack-year history of smoking and currently smoke or have quit within the past 15 years. Colorectal cancer screening. All adults should have this screening starting at  age 50 and continuing until age 75. Your health care provider may recommend screening at age 45 if you are at increased risk. You will have tests every 1-10 years, depending on your results and the type of screening test. Diabetes screening. This is done by checking your blood sugar (glucose) after you have not eaten for a while (fasting). You may have this done every 1-3 years. Mammogram. This may be done every 1-2 years. Talk with your health care provider about when you should start having regular mammograms. This may depend on whether you have a family history of breast cancer. BRCA-related cancer screening. This may be done if you have a family history of breast, ovarian, tubal, or peritoneal cancers. Pelvic exam and Pap test. This may be done every 3 years starting at age 21. Starting at age 30, this may be done every 5 years if you have a Pap test in combination with an HPV test. Other tests STD (sexually transmitted disease) testing, if you are at risk. Bone density scan. This is done to screen for osteoporosis. You may have this scan if you are at high risk for osteoporosis. Talk with your health care provider about your test results, treatment options, and if necessary, the need for more tests. Follow these instructions at home: Eating and drinking  Eat a diet that includes fresh fruits and vegetables, whole grains, lean protein, and low-fat dairy products. Take vitamin and mineral supplements as recommended by your health care provider. Do not drink alcohol if: Your health care provider tells you not to drink. You are pregnant, may be pregnant, or are planning to become pregnant. If you drink alcohol: Limit how much you have to 0-1 drink a day. Be   aware of how much alcohol is in your drink. In the [place] one drink equals one 12 oz bottle of beer (355 mL), one 5 oz glass of wine (148 mL), or one 1 oz glass of hard liquor (44 mL). Lifestyle Take daily care of your teeth and  gums. Brush your teeth every morning and night with fluoride toothpaste. Floss one time each day. Stay active. Exercise for at least 30 minutes 5 or more days each week. Do not use any products that contain nicotine or tobacco, such as cigarettes, e-cigarettes, and chewing tobacco. If you need help quitting, ask your health care provider. Do not use drugs. If you are sexually active, practice safe sex. Use a condom or other form of protection to prevent STIs (sexually transmitted infections). If you do not wish to become pregnant, use a form of birth control. If you plan to become pregnant, see your health care provider for a prepregnancy visit. If told by your health care provider, take low-dose aspirin daily starting at age 63. Find healthy ways to cope with stress, such as: Meditation, yoga, or listening to music. Journaling. Talking to a trusted person. Spending time with friends and family. Safety Always wear your seat belt while driving or riding in a vehicle. Do not drive: If you have been drinking alcohol. Do not ride with someone who has been drinking. When you are tired or distracted. While texting. Wear a helmet and other protective equipment during sports activities. If you have firearms in your house, make sure you follow all gun safety procedures. What's next? Visit your health care provider once a year for an annual wellness visit. Ask your health care provider how often you should have your eyes and teeth checked. Stay up to date on all vaccines. This information is not intended to replace advice given to you by your health care provider. Make sure you discuss any questions you have with your health care provider. Document Revised: 04/05/2020 Document Reviewed: 10/07/2017 Elsevier Patient Education  2022 [employer].

## 2020-11-07 LAB — HEMOGLOBIN A1C
Est. average glucose Bld gHb Est-mCnc: 123 mg/dL
Hgb A1c MFr Bld: 5.9 % — ABNORMAL HIGH (ref 4.8–5.6)

## 2020-11-07 LAB — LIPID PANEL
Chol/HDL Ratio: 2.6 ratio (ref 0.0–4.4)
Cholesterol, Total: 155 mg/dL (ref 100–199)
HDL: 60 mg/dL (ref >39)
LDL Chol Calc (NIH): 86 mg/dL (ref 0–99)
Triglycerides: 41 mg/dL (ref 0–149)
VLDL Cholesterol Cal: 9 mg/dL (ref 5–40)

## 2020-11-07 LAB — THYROID PANEL WITH TSH
Free Thyroxine Index: 2.5 (ref 1.2–4.9)
T3 Uptake Ratio: 27 % (ref 24–39)
T4, Total: 9.1 ug/dL (ref 4.5–12.0)
TSH: 0.845 u[IU]/mL (ref 0.450–4.500)

## 2020-11-07 LAB — HEPATITIS C ANTIBODY: Hep C Virus Ab: <0.1 s/co ratio (ref 0.0–0.9)

## 2020-11-08 LAB — CYTOLOGY - PAP
Comment: NEGATIVE
Diagnosis: NEGATIVE
High risk HPV: NEGATIVE

## 2020-11-10 ENCOUNTER — Other Ambulatory Visit: Payer: Self-pay | Admitting: Certified Nurse Midwife

## 2020-11-10 MED ORDER — FLUCONAZOLE 150 MG PO TABS: 150.0000 mg | ORAL_TABLET | Freq: Once | ORAL | 0 refills | Status: AC

## 2020-11-20 ENCOUNTER — Other Ambulatory Visit: Payer: BC Managed Care – PPO

## 2020-11-21 ENCOUNTER — Other Ambulatory Visit: Payer: BC Managed Care – PPO

## 2020-11-29 ENCOUNTER — Ambulatory Visit (INDEPENDENT_AMBULATORY_CARE_PROVIDER_SITE_OTHER): Payer: BC Managed Care – PPO

## 2020-11-29 ENCOUNTER — Other Ambulatory Visit: Payer: Self-pay

## 2020-11-29 DIAGNOSIS — D259 Leiomyoma of uterus, unspecified: Secondary | ICD-10-CM

## 2020-11-29 DIAGNOSIS — Z01419 Encounter for gynecological examination (general) (routine) without abnormal findings: Secondary | ICD-10-CM

## 2021-11-11 ENCOUNTER — Encounter: Payer: BC Managed Care – PPO | Admitting: Certified Nurse Midwife

## 2023-03-01 NOTE — Progress Notes (Signed)
PT doesn't have any SDOH insecurities. Pt has a pcp and insurance.

## 2023-04-07 NOTE — Progress Notes (Signed)
 The patient attended a screening event on 03/01/23 where her BP screening results was 137/99. At the event the patient noted she did not have any SDOH insecurities. Pt noted that she has insurance and has a pcp and does not smoke. Per chart review pcp is not visible in chl. Chart review also indicates a future new pt appt on 04/22/23.  Spoke with pt and advised her to let her new pcp know about her recent BP results. Pt stated she was working with a Consulting civil engineer and was rushing but stated that she would let her new pcp know about bp. No additional Health equity team support indicated at this time.

## 2023-11-02 ENCOUNTER — Other Ambulatory Visit: Payer: Self-pay | Admitting: Internal Medicine

## 2023-11-02 DIAGNOSIS — Z1231 Encounter for screening mammogram for malignant neoplasm of breast: Secondary | ICD-10-CM
